# Patient Record
Sex: Female | Born: 1990
Health system: Southern US, Community
[De-identification: ages and names within clinical notes are randomized; demographics above are authoritative.]

## PROBLEM LIST (undated history)

## (undated) DIAGNOSIS — K219 Gastro-esophageal reflux disease without esophagitis: Secondary | ICD-10-CM

## (undated) HISTORY — PX: NO PAST SURGERIES: SHX2092

---

## 2016-07-30 ENCOUNTER — Ambulatory Visit (INDEPENDENT_AMBULATORY_CARE_PROVIDER_SITE_OTHER): Payer: BLUE CROSS/BLUE SHIELD | Admitting: Adult Health

## 2016-07-30 ENCOUNTER — Encounter: Payer: Self-pay | Admitting: Adult Health

## 2016-07-30 VITALS — BP 108/78 | HR 86 | Ht 62.0 in | Wt 208.5 lb

## 2016-07-30 DIAGNOSIS — Z3201 Encounter for pregnancy test, result positive: Secondary | ICD-10-CM | POA: Diagnosis not present

## 2016-07-30 DIAGNOSIS — Z3490 Encounter for supervision of normal pregnancy, unspecified, unspecified trimester: Secondary | ICD-10-CM

## 2016-07-30 DIAGNOSIS — N926 Irregular menstruation, unspecified: Secondary | ICD-10-CM

## 2016-07-30 DIAGNOSIS — O3680X0 Pregnancy with inconclusive fetal viability, not applicable or unspecified: Secondary | ICD-10-CM

## 2016-07-30 LAB — POCT URINE PREGNANCY: Preg Test, Ur: POSITIVE — AB

## 2016-07-30 MED ORDER — PRENATAL PLUS 27-1 MG PO TABS: 1.0000 | ORAL_TABLET | Freq: Every day | ORAL | 11 refills | Status: DC

## 2016-07-30 NOTE — Patient Instructions (Addendum)
First Trimester of Pregnancy The first trimester of pregnancy is from week 1 until the end of week 12 (months 1 through 3). A week after a sperm fertilizes an egg, the egg will implant on the wall of the uterus. This embryo will begin to develop into a baby. Genes from you and your partner are forming the baby. The female genes determine whether the baby is a boy or a girl. At 6-8 weeks, the eyes and face are formed, and the heartbeat can be seen on ultrasound. At the end of 12 weeks, all the baby's organs are formed.  Now that you are pregnant, you will want to do everything you can to have a healthy baby. Two of the most important things are to get good prenatal care and to follow your health care provider's instructions. Prenatal care is all the medical care you receive before the baby's birth. This care will help prevent, find, and treat any problems during the pregnancy and childbirth. BODY CHANGES Your body goes through many changes during pregnancy. The changes vary from woman to woman.   You may gain or lose a couple of pounds at first.  You may feel sick to your stomach (nauseous) and throw up (vomit). If the vomiting is uncontrollable, call your health care provider.  You may tire easily.  You may develop headaches that can be relieved by medicines approved by your health care provider.  You may urinate more often. Painful urination may mean you have a bladder infection.  You may develop heartburn as a result of your pregnancy.  You may develop constipation because certain hormones are causing the muscles that push waste through your intestines to slow down.  You may develop hemorrhoids or swollen, bulging veins (varicose veins).  Your breasts may begin to grow larger and become tender. Your nipples may stick out more, and the tissue that surrounds them (areola) may become darker.  Your gums may bleed and may be sensitive to brushing and flossing.  Dark spots or blotches (chloasma,  mask of pregnancy) may develop on your face. This will likely fade after the baby is born.  Your menstrual periods will stop.  You may have a loss of appetite.  You may develop cravings for certain kinds of food.  You may have changes in your emotions from day to day, such as being excited to be pregnant or being concerned that something may go wrong with the pregnancy and baby.  You may have more vivid and strange dreams.  You may have changes in your hair. These can include thickening of your hair, rapid growth, and changes in texture. Some women also have hair loss during or after pregnancy, or hair that feels dry or thin. Your hair will most likely return to normal after your baby is born. WHAT TO EXPECT AT YOUR PRENATAL VISITS During a routine prenatal visit:  You will be weighed to make sure you and the baby are growing normally.  Your blood pressure will be taken.  Your abdomen will be measured to track your baby's growth.  The fetal heartbeat will be listened to starting around week 10 or 12 of your pregnancy.  Test results from any previous visits will be discussed. Your health care provider may ask you:  How you are feeling.  If you are feeling the baby move.  If you have had any abnormal symptoms, such as leaking fluid, bleeding, severe headaches, or abdominal cramping.  If you are using any tobacco products,   including cigarettes, chewing tobacco, and electronic cigarettes.  If you have any questions. Other tests that may be performed during your first trimester include:  Blood tests to find your blood type and to check for the presence of any previous infections. They will also be used to check for low iron levels (anemia) and Rh antibodies. Later in the pregnancy, blood tests for diabetes will be done along with other tests if problems develop.  Urine tests to check for infections, diabetes, or protein in the urine.  An ultrasound to confirm the proper growth  and development of the baby.  An amniocentesis to check for possible genetic problems.  Fetal screens for spina bifida and Down syndrome.  You may need other tests to make sure you and the baby are doing well.  HIV (human immunodeficiency virus) testing. Routine prenatal testing includes screening for HIV, unless you choose not to have this test. HOME CARE INSTRUCTIONS  Medicines  Follow your health care provider's instructions regarding medicine use. Specific medicines may be either safe or unsafe to take during pregnancy.  Take your prenatal vitamins as directed.  If you develop constipation, try taking a stool softener if your health care provider approves. Diet  Eat regular, well-balanced meals. Choose a variety of foods, such as meat or vegetable-based protein, fish, milk and low-fat dairy products, vegetables, fruits, and whole grain breads and cereals. Your health care provider will help you determine the amount of weight gain that is right for you.  Avoid raw meat and uncooked cheese. These carry germs that can cause birth defects in the baby.  Eating four or five small meals rather than three large meals a day may help relieve nausea and vomiting. If you start to feel nauseous, eating a few soda crackers can be helpful. Drinking liquids between meals instead of during meals also seems to help nausea and vomiting.  If you develop constipation, eat more high-fiber foods, such as fresh vegetables or fruit and whole grains. Drink enough fluids to keep your urine clear or pale yellow. Activity and Exercise  Exercise only as directed by your health care provider. Exercising will help you:  Control your weight.  Stay in shape.  Be prepared for labor and delivery.  Experiencing pain or cramping in the lower abdomen or low back is a good sign that you should stop exercising. Check with your health care provider before continuing normal exercises.  Try to avoid standing for long  periods of time. Move your legs often if you must stand in one place for a long time.  Avoid heavy lifting.  Wear low-heeled shoes, and practice good posture.  You may continue to have sex unless your health care provider directs you otherwise. Relief of Pain or Discomfort  Wear a good support bra for breast tenderness.   Take warm sitz baths to soothe any pain or discomfort caused by hemorrhoids. Use hemorrhoid cream if your health care provider approves.   Rest with your legs elevated if you have leg cramps or low back pain.  If you develop varicose veins in your legs, wear support hose. Elevate your feet for 15 minutes, 3-4 times a day. Limit salt in your diet. Prenatal Care  Schedule your prenatal visits by the twelfth week of pregnancy. They are usually scheduled monthly at first, then more often in the last 2 months before delivery.  Write down your questions. Take them to your prenatal visits.  Keep all your prenatal visits as directed by your   health care provider. Safety  Wear your seat belt at all times when driving.  Make a list of emergency phone numbers, including numbers for family, friends, the hospital, and police and fire departments. General Tips  Ask your health care provider for a referral to a local prenatal education class. Begin classes no later than at the beginning of month 6 of your pregnancy.  Ask for help if you have counseling or nutritional needs during pregnancy. Your health care provider can offer advice or refer you to specialists for help with various needs.  Do not use hot tubs, steam rooms, or saunas.  Do not douche or use tampons or scented sanitary pads.  Do not cross your legs for long periods of time.  Avoid cat litter boxes and soil used by cats. These carry germs that can cause birth defects in the baby and possibly loss of the fetus by miscarriage or stillbirth.  Avoid all smoking, herbs, alcohol, and medicines not prescribed by  your health care provider. Chemicals in these affect the formation and growth of the baby.  Do not use any tobacco products, including cigarettes, chewing tobacco, and electronic cigarettes. If you need help quitting, ask your health care provider. You may receive counseling support and other resources to help you quit.  Schedule a dentist appointment. At home, brush your teeth with a soft toothbrush and be gentle when you floss. SEEK MEDICAL CARE IF:   You have dizziness.  You have mild pelvic cramps, pelvic pressure, or nagging pain in the abdominal area.  You have persistent nausea, vomiting, or diarrhea.  You have a bad smelling vaginal discharge.  You have pain with urination.  You notice increased swelling in your face, hands, legs, or ankles. SEEK IMMEDIATE MEDICAL CARE IF:   You have a fever.  You are leaking fluid from your vagina.  You have spotting or bleeding from your vagina.  You have severe abdominal cramping or pain.  You have rapid weight gain or loss.  You vomit blood or material that looks like coffee grounds.  You are exposed to German measles and have never had them.  You are exposed to fifth disease or chickenpox.  You develop a severe headache.  You have shortness of breath.  You have any kind of trauma, such as from a fall or a car accident.   This information is not intended to replace advice given to you by your health care provider. Make sure you discuss any questions you have with your health care provider.   Document Released: 10/05/2001 Document Revised: 11/01/2014 Document Reviewed: 08/21/2013 Elsevier Interactive Patient Education 2016 [employer] Return in 1 week for [place] 

## 2016-07-30 NOTE — Progress Notes (Signed)
Subjective:     Patient ID: Kelly Mahoney, female   DOB: 1991/08/17, 25 y.o.   MRN: [id number]  HPI Kelly Mahoney is a 25 year old Hispanic female in for UPT, has missed a period and has some cramps on the left and low back pain a times, not bad.  Review of Systems Patient denies any headaches, hearing loss, fatigue, blurred vision, shortness of breath, chest pain, abdominal pain, problems with bowel movements, urination, or intercourse. No joint pain or mood swings.Has some cramps on left and low back pain at times. Reviewed past medical,surgical, social and family history. Reviewed medications and allergies.     Objective:   Physical Exam BP 108/78 (BP Location: Left Arm, Patient Position: Sitting, Cuff Size: Large)   Pulse 86   Ht 5\' 2"  (1.575 m)   Wt 208 lb 8 oz (94.6 kg)   LMP 06/17/2016 (Exact Date)   BMI 38.14 kg/m UPT positive, about 6+ 1week by LMP with EDD 03/24/17, Skin warm and dry. Neck: mid line trachea, normal thyroid, good ROM, no lymphadenopathy noted. Lungs: clear to ausculation bilaterally. Cardiovascular: regular rate and rhythm.Abdomen is soft and non tender.   PHQ 2 score 0.  Assessment:     1. Pregnancy examination or test, positive result   2. Pregnancy, unspecified gestational age   13. Pregnancy with inconclusive fetal viability, not applicable or unspecified fetus       Plan:     Rx prenatal plus #30 take 1 daily with 11 refills Return in 1 week for dating US Review handout on first trimester  Increase fluids,OK to take tylenol

## 2016-08-06 ENCOUNTER — Other Ambulatory Visit: Payer: Self-pay | Admitting: Adult Health

## 2016-08-06 ENCOUNTER — Ambulatory Visit (INDEPENDENT_AMBULATORY_CARE_PROVIDER_SITE_OTHER): Payer: BLUE CROSS/BLUE SHIELD

## 2016-08-06 ENCOUNTER — Other Ambulatory Visit: Payer: Managed Care, Other (non HMO)

## 2016-08-06 DIAGNOSIS — O3680X0 Pregnancy with inconclusive fetal viability, not applicable or unspecified: Secondary | ICD-10-CM

## 2016-08-06 DIAGNOSIS — Z3A01 Less than 8 weeks gestation of pregnancy: Secondary | ICD-10-CM

## 2016-08-06 NOTE — Progress Notes (Signed)
US 6+1 wks single IUP w/ys pos fht 113 bpm,normal ov's bilat,crl 4.6 mm

## 2016-08-16 ENCOUNTER — Ambulatory Visit (INDEPENDENT_AMBULATORY_CARE_PROVIDER_SITE_OTHER): Payer: BLUE CROSS/BLUE SHIELD | Admitting: Women's Health

## 2016-08-16 ENCOUNTER — Encounter: Payer: Self-pay | Admitting: Women's Health

## 2016-08-16 ENCOUNTER — Other Ambulatory Visit (HOSPITAL_COMMUNITY)
Admission: RE | Admit: 2016-08-16 | Discharge: 2016-08-16 | Disposition: A | Discharge status: Home / Self Care | Payer: Managed Care, Other (non HMO) | Source: Ambulatory Visit | Attending: Obstetrics & Gynecology | Admitting: Obstetrics & Gynecology

## 2016-08-16 VITALS — BP 124/70 | HR 80 | Wt 205.0 lb

## 2016-08-16 DIAGNOSIS — O210 Mild hyperemesis gravidarum: Secondary | ICD-10-CM

## 2016-08-16 DIAGNOSIS — Z3A08 8 weeks gestation of pregnancy: Secondary | ICD-10-CM

## 2016-08-16 DIAGNOSIS — Z3400 Encounter for supervision of normal first pregnancy, unspecified trimester: Secondary | ICD-10-CM | POA: Insufficient documentation

## 2016-08-16 DIAGNOSIS — Z331 Pregnant state, incidental: Secondary | ICD-10-CM | POA: Diagnosis not present

## 2016-08-16 DIAGNOSIS — Z113 Encounter for screening for infections with a predominantly sexual mode of transmission: Secondary | ICD-10-CM | POA: Diagnosis present

## 2016-08-16 DIAGNOSIS — Z1389 Encounter for screening for other disorder: Secondary | ICD-10-CM

## 2016-08-16 DIAGNOSIS — Z3682 Encounter for antenatal screening for nuchal translucency: Secondary | ICD-10-CM

## 2016-08-16 DIAGNOSIS — Z124 Encounter for screening for malignant neoplasm of cervix: Secondary | ICD-10-CM

## 2016-08-16 DIAGNOSIS — Z0283 Encounter for blood-alcohol and blood-drug test: Secondary | ICD-10-CM

## 2016-08-16 DIAGNOSIS — Z01419 Encounter for gynecological examination (general) (routine) without abnormal findings: Secondary | ICD-10-CM | POA: Diagnosis present

## 2016-08-16 DIAGNOSIS — Z23 Encounter for immunization: Secondary | ICD-10-CM | POA: Diagnosis not present

## 2016-08-16 DIAGNOSIS — Z3401 Encounter for supervision of normal first pregnancy, first trimester: Secondary | ICD-10-CM

## 2016-08-16 LAB — POCT URINALYSIS DIPSTICK
Glucose, UA: NEGATIVE
Nitrite, UA: NEGATIVE
PROTEIN UA: NEGATIVE
RBC UA: NEGATIVE

## 2016-08-16 NOTE — Progress Notes (Signed)
  Subjective:  Kelly Mahoney is a 25 y.o. G1P0 Hispanic female at 1522w4d by 6wk u/s, being seen today for her first obstetrical visit.  Her obstetrical history is significant for primigravida.  Pregnancy history fully reviewed.  Patient reports nausea for last couple of days, no vomiting- declines meds today. Denies vb, cramping, uti s/s, abnormal/malodorous vag d/c, or vulvovaginal itching/irritation.  BP 124/70   Pulse 80   Wt 205 lb (93 kg)   LMP 06/17/2016 (Exact Date)   BMI 37.49 kg/m   HISTORY: OB History  Gravida Para Term Preterm AB Living  1            SAB TAB Ectopic Multiple Live Births               # Outcome Date GA Lbr Len/2nd Weight Sex Delivery Anes PTL Lv  1 Current              History reviewed. No pertinent past medical history. History reviewed. No pertinent surgical history. History reviewed. No pertinent family history.  Exam   System:     General: Well developed & nourished, no acute distress   Skin: Warm & dry, normal coloration and turgor, no rashes   Neurologic: Alert & oriented, normal mood   Cardiovascular: Regular rate & rhythm   Respiratory: Effort & rate normal, LCTAB, acyanotic   Abdomen: Soft, non tender   Extremities: normal strength, tone   Pelvic Exam:    Perineum: Normal perineum   Vulva: Normal, no lesions   Vagina:  Normal mucosa, normal discharge   Cervix: Normal, bulbous, appears closed   Uterus: Normal size/shape/contour for GA   Thin prep pap smear obtained w/ reflex high risk HPV cotesting FHR: + via informal transabdominal u/s   Assessment:   Pregnancy: G1P0 Patient Active Problem List   Diagnosis Date Noted  . Supervision of normal first pregnancy 08/16/2016    Priority: High    8922w4d G1P0 New OB visit Nausea  Plan:  Initial labs drawn Continue prenatal vitamins Problem list reviewed and updated Reviewed n/v relief measures and warning s/s to report Reviewed recommended weight gain based on pre-gravid  BMI Encouraged well-balanced diet Genetic Screening discussed Integrated Screen: requested Cystic fibrosis screening discussed declined Ultrasound discussed; fetal survey: requested Follow up in 4 1/2 weeks for 1st it/nt and visit [facility] completed Flu shot today  Marge DuncansBooker, Hu[place] Glaus Randall CNM, [facility] LLCWHNP-BC 08/16/2016 2:47 PM

## 2016-08-16 NOTE — Patient Instructions (Signed)
For your lower back pain you may:  Purchase a pregnancy belt from [employer] [name], [employer], [employer], etc and wear it while you are up and about  Take warm baths  Use a heating pad to your lower back for no longer than 20 minutes at a time, and do not place near abdomen  Take tylenol as needed. Please follow directions on the bottle   Nausea & Vomiting  Have saltine crackers or pretzels by your bed and eat a few bites before you raise your head out of bed in the morning  Eat small frequent meals throughout the day instead of large meals  Drink plenty of fluids throughout the day to stay hydrated, just don't drink a lot of fluids with your meals.  This can make your stomach fill up faster making you feel sick  Do not brush your teeth right after you eat  Products with real ginger are good for nausea, like ginger ale and ginger hard candy Make sure it says made with real ginger!  Sucking on sour candy like lemon heads is also good for nausea  If your prenatal vitamins make you nauseated, take them at night so you will sleep through the nausea  Sea Bands  If you feel like you need medicine for the nausea & vomiting please let us know  If you are unable to keep any fluids or food down please let us know   Constipation  Drink plenty of fluid, preferably water, throughout the day  Eat foods high in fiber such as fruits, vegetables, and grains  Exercise, such as walking, is a good way to keep your bowels regular  Drink warm fluids, especially warm prune juice, or decaf coffee  Eat a 1/2 cup of real oatmeal (not instant), 1/2 cup applesauce, and 1/2-1 cup warm prune juice every day  If needed, you may take Colace (docusate sodium) stool softener once or twice a day to help keep the stool soft. If you are pregnant, wait until you are out of your first trimester (12-14 weeks of pregnancy)  If you still are having problems with constipation, you may take Miralax once daily  as needed to help keep your bowels regular.  If you are pregnant, wait until you are out of your first trimester (12-14 weeks of pregnancy)   First Trimester of Pregnancy The first trimester of pregnancy is from week 1 until the end of week 12 (months 1 through 3). A week after a sperm fertilizes an egg, the egg will implant on the wall of the uterus. This embryo will begin to develop into a baby. Genes from you and your partner are forming the baby. The female genes determine whether the baby is a boy or a girl. At 6-8 weeks, the eyes and face are formed, and the heartbeat can be seen on ultrasound. At the end of 12 weeks, all the baby's organs are formed.  Now that you are pregnant, you will want to do everything you can to have a healthy baby. Two of the most important things are to get good prenatal care and to follow your health care provider's instructions. Prenatal care is all the medical care you receive before the baby's birth. This care will help prevent, find, and treat any problems during the pregnancy and childbirth. BODY CHANGES Your body goes through many changes during pregnancy. The changes vary from woman to woman.   You may gain or lose a couple of pounds at first.  You may feel sick to your stomach (nauseous) and throw up (vomit). If the vomiting is uncontrollable, call your health care provider.  You may tire easily.  You may develop headaches that can be relieved by medicines approved by your health care provider.  You may urinate more often. Painful urination may mean you have a bladder infection.  You may develop heartburn as a result of your pregnancy.  You may develop constipation because certain hormones are causing the muscles that push waste through your intestines to slow down.  You may develop hemorrhoids or swollen, bulging veins (varicose veins).  Your breasts may begin to grow larger and become tender. Your nipples may stick out more, and the tissue that  surrounds them (areola) may become darker.  Your gums may bleed and may be sensitive to brushing and flossing.  Dark spots or blotches (chloasma, mask of pregnancy) may develop on your face. This will likely fade after the baby is born.  Your menstrual periods will stop.  You may have a loss of appetite.  You may develop cravings for certain kinds of food.  You may have changes in your emotions from day to day, such as being excited to be pregnant or being concerned that something may go wrong with the pregnancy and baby.  You may have more vivid and strange dreams.  You may have changes in your hair. These can include thickening of your hair, rapid growth, and changes in texture. Some women also have hair loss during or after pregnancy, or hair that feels dry or thin. Your hair will most likely return to normal after your baby is born. WHAT TO EXPECT AT YOUR PRENATAL VISITS During a routine prenatal visit:  You will be weighed to make sure you and the baby are growing normally.  Your blood pressure will be taken.  Your abdomen will be measured to track your baby's growth.  The fetal heartbeat will be listened to starting around week 10 or 12 of your pregnancy.  Test results from any previous visits will be discussed. Your health care provider may ask you:  How you are feeling.  If you are feeling the baby move.  If you have had any abnormal symptoms, such as leaking fluid, bleeding, severe headaches, or abdominal cramping.  If you have any questions. Other tests that may be performed during your first trimester include:  Blood tests to find your blood type and to check for the presence of any previous infections. They will also be used to check for low iron levels (anemia) and Rh antibodies. Later in the pregnancy, blood tests for diabetes will be done along with other tests if problems develop.  Urine tests to check for infections, diabetes, or protein in the urine.  An  ultrasound to confirm the proper growth and development of the baby.  An amniocentesis to check for possible genetic problems.  Fetal screens for spina bifida and Down syndrome.  You may need other tests to make sure you and the baby are doing well. HOME CARE INSTRUCTIONS  Medicines  Follow your health care provider's instructions regarding medicine use. Specific medicines may be either safe or unsafe to take during pregnancy.  Take your prenatal vitamins as directed.  If you develop constipation, try taking a stool softener if your health care provider approves. Diet  Eat regular, well-balanced meals. Choose a variety of foods, such as meat or vegetable-based protein, fish, milk and low-fat dairy products, vegetables, fruits, and whole grain breads  and cereals. Your health care provider will help you determine the amount of weight gain that is right for you.  Avoid raw meat and uncooked cheese. These carry germs that can cause birth defects in the baby.  Eating four or five small meals rather than three large meals a day may help relieve nausea and vomiting. If you start to feel nauseous, eating a few soda crackers can be helpful. Drinking liquids between meals instead of during meals also seems to help nausea and vomiting.  If you develop constipation, eat more high-fiber foods, such as fresh vegetables or fruit and whole grains. Drink enough fluids to keep your urine clear or pale yellow. Activity and Exercise  Exercise only as directed by your health care provider. Exercising will help you:  Control your weight.  Stay in shape.  Be prepared for labor and delivery.  Experiencing pain or cramping in the lower abdomen or low back is a good sign that you should stop exercising. Check with your health care provider before continuing normal exercises.  Try to avoid standing for long periods of time. Move your legs often if you must stand in one place for a long time.  Avoid heavy  lifting.  Wear low-heeled shoes, and practice good posture.  You may continue to have sex unless your health care provider directs you otherwise. Relief of Pain or Discomfort  Wear a good support bra for breast tenderness.   Take warm sitz baths to soothe any pain or discomfort caused by hemorrhoids. Use hemorrhoid cream if your health care provider approves.   Rest with your legs elevated if you have leg cramps or low back pain.  If you develop varicose veins in your legs, wear support hose. Elevate your feet for 15 minutes, 3-4 times a day. Limit salt in your diet. Prenatal Care  Schedule your prenatal visits by the twelfth week of pregnancy. They are usually scheduled monthly at first, then more often in the last 2 months before delivery.  Write down your questions. Take them to your prenatal visits.  Keep all your prenatal visits as directed by your health care provider. Safety  Wear your seat belt at all times when driving.  Make a list of emergency phone numbers, including numbers for family, friends, the hospital, and police and fire departments. General Tips  Ask your health care provider for a referral to a local prenatal education class. Begin classes no later than at the beginning of month 6 of your pregnancy.  Ask for help if you have counseling or nutritional needs during pregnancy. Your health care provider can offer advice or refer you to specialists for help with various needs.  Do not use hot tubs, steam rooms, or saunas.  Do not douche or use tampons or scented sanitary pads.  Do not cross your legs for long periods of time.  Avoid cat litter boxes and soil used by cats. These carry germs that can cause birth defects in the baby and possibly loss of the fetus by miscarriage or stillbirth.  Avoid all smoking, herbs, alcohol, and medicines not prescribed by your health care provider. Chemicals in these affect the formation and growth of the baby.  Schedule  a dentist appointment. At home, brush your teeth with a soft toothbrush and be gentle when you floss. SEEK MEDICAL CARE IF:   You have dizziness.  You have mild pelvic cramps, pelvic pressure, or nagging pain in the abdominal area.  You have persistent nausea, vomiting, or  diarrhea.  You have a bad smelling vaginal discharge.  You have pain with urination.  You notice increased swelling in your face, hands, legs, or ankles. SEEK IMMEDIATE MEDICAL CARE IF:   You have a fever.  You are leaking fluid from your vagina.  You have spotting or bleeding from your vagina.  You have severe abdominal cramping or pain.  You have rapid weight gain or loss.  You vomit blood or material that looks like coffee grounds.  You are exposed to [place] measles and have never had them.  You are exposed to fifth disease or chickenpox.  You develop a severe headache.  You have shortness of breath.  You have any kind of trauma, such as from a fall or a car accident. Document Released: 10/05/2001 Document Revised: 02/25/2014 Document Reviewed: 08/21/2013 [facility] Patient Information 2015 [place] [place]. This information is not intended to replace advice given to you by your health care provider. Make sure you discuss any questions you have with your health care provider.

## 2016-08-17 LAB — CBC
Hematocrit: 38.9 % (ref 34.0–46.6)
Hemoglobin: 13.3 g/dL (ref 11.1–15.9)
MCH: 29.3 pg (ref 26.6–33.0)
MCHC: 34.2 g/dL (ref 31.5–35.7)
MCV: 86 fL (ref 79–97)
PLATELETS: 351 10*3/uL (ref 150–379)
RBC: 4.54 x10E6/uL (ref 3.77–5.28)
RDW: 12.9 % (ref 12.3–15.4)
WBC: 12.2 10*3/uL — ABNORMAL HIGH (ref 3.4–10.8)

## 2016-08-17 LAB — PMP SCREEN PROFILE (10S), URINE
AMPHETAMINE SCRN UR: NEGATIVE ng/mL
BARBITURATE SCRN UR: NEGATIVE ng/mL
Benzodiazepine Screen, Urine: NEGATIVE ng/mL
CREATININE(CRT), U: 115.6 mg/dL (ref 20.0–300.0)
Cannabinoids Ur Ql Scn: NEGATIVE ng/mL
Cocaine(Metab.)Screen, Urine: NEGATIVE ng/mL
METHADONE SCREEN, URINE: NEGATIVE ng/mL
Opiate Scrn, Ur: NEGATIVE ng/mL
Oxycodone+Oxymorphone Ur Ql Scn: NEGATIVE ng/mL
PCP SCRN UR: NEGATIVE ng/mL
PH UR, DRUG SCRN: 5.8 (ref 4.5–8.9)
PROPOXYPHENE SCREEN: NEGATIVE ng/mL

## 2016-08-17 LAB — ABO/RH: Rh Factor: POSITIVE

## 2016-08-17 LAB — URINALYSIS, ROUTINE W REFLEX MICROSCOPIC
BILIRUBIN UA: NEGATIVE
GLUCOSE, UA: NEGATIVE
NITRITE UA: NEGATIVE
PROTEIN UA: NEGATIVE
RBC, UA: NEGATIVE
SPEC GRAV UA: 1.023 (ref 1.005–1.030)
UUROB: 0.2 mg/dL (ref 0.2–1.0)
pH, UA: 6 (ref 5.0–7.5)

## 2016-08-17 LAB — MICROSCOPIC EXAMINATION: CASTS: NONE SEEN /LPF

## 2016-08-17 LAB — HIV ANTIBODY (ROUTINE TESTING W REFLEX): HIV Screen 4th Generation wRfx: NONREACTIVE

## 2016-08-17 LAB — RUBELLA SCREEN: Rubella Antibodies, IGG: 1.67 index (ref >0.99)

## 2016-08-17 LAB — VARICELLA ZOSTER ANTIBODY, IGG

## 2016-08-17 LAB — HEPATITIS B SURFACE ANTIGEN: Hepatitis B Surface Ag: NEGATIVE

## 2016-08-17 LAB — ANTIBODY SCREEN: ANTIBODY SCREEN: NEGATIVE

## 2016-08-17 LAB — SICKLE CELL SCREEN: Sickle Cell Screen: NEGATIVE

## 2016-08-17 LAB — RPR: RPR Ser Ql: NONREACTIVE

## 2016-08-18 LAB — CYTOLOGY - PAP
CHLAMYDIA, DNA PROBE: NEGATIVE
Diagnosis: NEGATIVE
Neisseria Gonorrhea: NEGATIVE

## 2016-08-18 LAB — URINE CULTURE

## 2016-08-19 ENCOUNTER — Encounter: Payer: Self-pay | Admitting: Women's Health

## 2016-08-19 DIAGNOSIS — Z2839 Other underimmunization status: Secondary | ICD-10-CM | POA: Insufficient documentation

## 2016-08-19 DIAGNOSIS — O09899 Supervision of other high risk pregnancies, unspecified trimester: Secondary | ICD-10-CM | POA: Insufficient documentation

## 2016-08-19 DIAGNOSIS — Z283 Underimmunization status: Secondary | ICD-10-CM

## 2016-09-14 ENCOUNTER — Telehealth: Payer: Self-pay | Admitting: Obstetrics and Gynecology

## 2016-09-14 MED ORDER — DOXYLAMINE-PYRIDOXINE 10-10 MG PO TBEC: DELAYED_RELEASE_TABLET | ORAL | 1 refills | Status: DC

## 2016-09-14 NOTE — Telephone Encounter (Signed)
Pt called stating that she is having really bad neasua and vomiting for 2 weeks. Pt would like for the doctor to call something in. Please contact pt

## 2016-09-14 NOTE — Telephone Encounter (Signed)
Pt c/o early pregnancy nausea and vomiting. Pt has [employer] for insurance.

## 2016-09-14 NOTE — Telephone Encounter (Signed)
Will rx diclegis  

## 2016-09-15 ENCOUNTER — Encounter: Payer: Self-pay | Admitting: Obstetrics and Gynecology

## 2016-09-15 ENCOUNTER — Ambulatory Visit (INDEPENDENT_AMBULATORY_CARE_PROVIDER_SITE_OTHER): Payer: 59

## 2016-09-15 ENCOUNTER — Other Ambulatory Visit: Payer: BLUE CROSS/BLUE SHIELD

## 2016-09-15 ENCOUNTER — Ambulatory Visit (INDEPENDENT_AMBULATORY_CARE_PROVIDER_SITE_OTHER): Payer: BLUE CROSS/BLUE SHIELD | Admitting: Obstetrics and Gynecology

## 2016-09-15 VITALS — BP 130/60 | HR 76 | Wt 194.2 lb

## 2016-09-15 DIAGNOSIS — Z3A12 12 weeks gestation of pregnancy: Secondary | ICD-10-CM

## 2016-09-15 DIAGNOSIS — Z3682 Encounter for antenatal screening for nuchal translucency: Secondary | ICD-10-CM

## 2016-09-15 DIAGNOSIS — O210 Mild hyperemesis gravidarum: Secondary | ICD-10-CM

## 2016-09-15 DIAGNOSIS — Z331 Pregnant state, incidental: Secondary | ICD-10-CM

## 2016-09-15 DIAGNOSIS — Z3401 Encounter for supervision of normal first pregnancy, first trimester: Secondary | ICD-10-CM

## 2016-09-15 DIAGNOSIS — Z1389 Encounter for screening for other disorder: Secondary | ICD-10-CM

## 2016-09-15 LAB — POCT URINALYSIS DIPSTICK
GLUCOSE UA: NEGATIVE
Nitrite, UA: NEGATIVE
RBC UA: NEGATIVE

## 2016-09-15 NOTE — Progress Notes (Signed)
US 11+6 wks,measurements c/w dates,crl 55.6 mm,normal ov's bilat,NB present,NT 1.4 mm,fhr 157 bpm,normal ov's bilat

## 2016-09-15 NOTE — Progress Notes (Signed)
Patient ID: Kelly Mahoney, female   DOB: 04-Jan-1991, 25 y.o.   MRN: [id number]  G1P0  Estimated Date of Delivery: 03/31/17 LROB 9567w6d  Blood pressure 130/60, pulse 76, weight 194 lb 3.2 oz (88.1 kg), last menstrual period 06/17/2016.    Urine results: notable for large ketones, 2+ protein, trace leukocytes   Chief Complaint  Patient presents with  . Routine Prenatal Visit    NT/IT,  nausea all day, not able to eat    Patient complaints: none. She denies dysuria, hematuria or frequency.   Patient reports  Too early for fetal movement. She denies any bleeding, rupture of membranes, or regular contractions.   Refer to the ob flow sheet for FH and FHR.    Physical Examination: General appearance - alert, well appearing, and in no distress                                      Abdomen -FHR 157 bpm                                                                                                   Questions were answered. Assessment: LROB G1P0 @ 1867w6d  1. UA likely contaminated with vaginal secretions as pt is asymptomatic  2. Childbirth classes encouraged   Plan:   1. Continued routine obstetrical care 2. Urine culture collected   F/u in 4 weeks for routine prenatal care   By signing my name below, I, Doreatha MartinEva Mathews, attest that this documentation has been prepared under the direction and in the presence of Tilda BurrowJohn V Kevonte Vanecek, MD. Electronically Signed: Doreatha MartinEva Mathews, ED Scribe. 09/15/16. 10:24 AM.  I personally performed the services described in this documentation, which was SCRIBED in my presence. The recorded information has been reviewed and considered accurate. It has been edited as necessary during review. Tilda BurrowFERGUSON,Kanav Kazmierczak V, MD

## 2016-09-18 LAB — MATERNAL SCREEN, INTEGRATED #1
Crown Rump Length: 55.6 mm
GEST. AGE ON COLLECTION DATE: 12.1 wk
MATERNAL AGE AT EDD: 25.3 a
NUMBER OF FETUSES: 1
Nuchal Translucency (NT): 1.4 mm
PAPP-A VALUE: 573.4 ng/mL
WEIGHT: 195 [lb_av]

## 2016-09-29 ENCOUNTER — Telehealth: Payer: Self-pay | Admitting: Advanced Practice Midwife

## 2016-09-29 ENCOUNTER — Other Ambulatory Visit: Payer: Self-pay | Admitting: Advanced Practice Midwife

## 2016-09-29 MED ORDER — ONDANSETRON HCL 4 MG PO TABS: 4.0000 mg | ORAL_TABLET | Freq: Three times a day (TID) | ORAL | 1 refills | Status: DC | PRN

## 2016-09-29 NOTE — Progress Notes (Signed)
zofran for nausea.

## 2016-09-29 NOTE — Telephone Encounter (Signed)
[facility]

## 2016-09-29 NOTE — Telephone Encounter (Signed)
Pt states taking the Diclegis for nausea and does not seem to be helping. She is taking as many as she can for a day and is still nauseated. Is there an alternative?

## 2016-10-13 ENCOUNTER — Encounter: Payer: Self-pay | Admitting: Obstetrics and Gynecology

## 2016-10-13 ENCOUNTER — Ambulatory Visit (INDEPENDENT_AMBULATORY_CARE_PROVIDER_SITE_OTHER): Payer: BLUE CROSS/BLUE SHIELD | Admitting: Obstetrics and Gynecology

## 2016-10-13 VITALS — BP 100/60 | HR 80 | Wt 197.0 lb

## 2016-10-13 DIAGNOSIS — Z369 Encounter for antenatal screening, unspecified: Secondary | ICD-10-CM

## 2016-10-13 DIAGNOSIS — Z3402 Encounter for supervision of normal first pregnancy, second trimester: Secondary | ICD-10-CM

## 2016-10-13 DIAGNOSIS — Z3A16 16 weeks gestation of pregnancy: Secondary | ICD-10-CM

## 2016-10-13 DIAGNOSIS — Z331 Pregnant state, incidental: Secondary | ICD-10-CM

## 2016-10-13 DIAGNOSIS — Z1389 Encounter for screening for other disorder: Secondary | ICD-10-CM

## 2016-10-13 LAB — POCT URINALYSIS DIPSTICK
GLUCOSE UA: NEGATIVE
Ketones, UA: NEGATIVE
NITRITE UA: NEGATIVE
PROTEIN UA: NEGATIVE
RBC UA: NEGATIVE

## 2016-10-13 NOTE — Progress Notes (Signed)
G1P0  Estimated Date of Delivery: 03/31/17 LROB 556w6d  Blood pressure 100/60, pulse 80, weight 197 lb (89.4 kg), last menstrual period 06/17/2016.   Urine results:notable for 1+ leukocytes   Chief Complaint  Patient presents with  . Routine Prenatal Visit    2nd IT    Patient complaints:none at this time. Patient reports   good fetal movement,                           denies any bleeding , rupture of membranes,or regular contractions.   refer to the ob flow sheet for FH and FHR,                         Physical Examination: General appearance - alert, well appearing, and in no distress                                      Abdomen - FH not indicated,                                                         -FHR 141                                                         soft, nontender, nondistended, no masses or organomegaly                                      Pelvic - examination not indicated                                            Questions were answered. Assessment: LROB G1P0 @ 706w6d , Estimated Date of Delivery: 03/31/17  Advised patient to maintain healthy weight  Plan:  Continued routine obstetrical care,   F/u in 4 weeks for LROB  By signing my name below, I, Sonum Patel, attest that this documentation has been prepared under the direction and in the presence of Tilda BurrowJohn V Shulamis Wenberg, MD. Electronically Signed: Sonum Patel, Neurosurgeoncribe. 10/13/16. 10:16 AM.  I personally performed the services described in this documentation, which was SCRIBED in my presence. The recorded information has been reviewed and considered accurate. It has been edited as necessary during review. Tilda BurrowFERGUSON,Cayden Granholm V, MD

## 2016-10-26 LAB — MATERNAL SCREEN, INTEGRATED #2
ADSF: 0.86
AFP MoM: 0.96
Alpha-Fetoprotein: 25 ng/mL
CROWN RUMP LENGTH: 55.6 mm
DIA MOM: 1.13
DIA Value: 170.6 pg/mL
ESTRIOL UNCONJUGATED: 0.67 ng/mL
GEST. AGE ON COLLECTION DATE: 12.1 wk
Gestational Age: 16.1 weeks
HCG MOM: 1.65
Maternal Age at EDD: 25.3 years
NUCHAL TRANSLUCENCY (NT): 1.4 mm
NUCHAL TRANSLUCENCY MOM: 0.96
NUMBER OF FETUSES: 1
PAPP-A MoM: 0.95
PAPP-A Value: 573.4 ng/mL
TEST RESULTS: NEGATIVE
Weight: 195 [lb_av]
Weight: 195 [lb_av]
hCG Value: 46.5 IU/mL

## 2016-11-12 ENCOUNTER — Encounter: Payer: Self-pay | Admitting: Women's Health

## 2016-11-12 ENCOUNTER — Ambulatory Visit (INDEPENDENT_AMBULATORY_CARE_PROVIDER_SITE_OTHER): Payer: BLUE CROSS/BLUE SHIELD | Admitting: Women's Health

## 2016-11-12 ENCOUNTER — Ambulatory Visit (INDEPENDENT_AMBULATORY_CARE_PROVIDER_SITE_OTHER): Payer: BLUE CROSS/BLUE SHIELD

## 2016-11-12 ENCOUNTER — Other Ambulatory Visit: Payer: Self-pay | Admitting: Obstetrics and Gynecology

## 2016-11-12 VITALS — BP 136/78 | HR 93 | Wt 196.0 lb

## 2016-11-12 DIAGNOSIS — Z3402 Encounter for supervision of normal first pregnancy, second trimester: Secondary | ICD-10-CM

## 2016-11-12 DIAGNOSIS — Z363 Encounter for antenatal screening for malformations: Secondary | ICD-10-CM

## 2016-11-12 DIAGNOSIS — Z3A20 20 weeks gestation of pregnancy: Secondary | ICD-10-CM

## 2016-11-12 DIAGNOSIS — O321XX1 Maternal care for breech presentation, fetus 1: Secondary | ICD-10-CM

## 2016-11-12 DIAGNOSIS — Z1389 Encounter for screening for other disorder: Secondary | ICD-10-CM

## 2016-11-12 DIAGNOSIS — Z331 Pregnant state, incidental: Secondary | ICD-10-CM

## 2016-11-12 LAB — POCT URINALYSIS DIPSTICK
Glucose, UA: NEGATIVE
KETONES UA: NEGATIVE
Leukocytes, UA: NEGATIVE
Nitrite, UA: NEGATIVE
RBC UA: NEGATIVE

## 2016-11-12 MED ORDER — ONDANSETRON HCL 4 MG PO TABS: 4.0000 mg | ORAL_TABLET | Freq: Three times a day (TID) | ORAL | 0 refills | Status: DC | PRN

## 2016-11-12 NOTE — Patient Instructions (Signed)
[place] Pediatricians/Family Doctors:  [facility] [phone]            [facility] [phone]                 [facility] [phone] (usually not accepting new patients unless you have family there already, you are always welcome to call and ask)            [facility] ([address] [place]) [phone]   [facility] Pediatricians/Family Doctors:   [facility]: [phone]  [facility]: [phone]   Second Trimester of Pregnancy The second trimester is from week 13 through week 28 (months 4 through 6). The second trimester is often a time when you feel your best. Your body has also adjusted to being pregnant, and you begin to feel better physically. Usually, morning sickness has lessened or quit completely, you may have more energy, and you may have an increase in appetite. The second trimester is also a time when the fetus is growing rapidly. At the end of the sixth month, the fetus is about 9 inches long and weighs about 1 pounds. You will likely begin to feel the baby move (quickening) between 18 and 20 weeks of the pregnancy. Body changes during your second trimester Your body continues to go through many changes during your second trimester. The changes vary from woman to woman.  Your weight will continue to increase. You will notice your lower abdomen bulging out.  You may begin to get stretch marks on your hips, abdomen, and breasts.  You may develop headaches that can be relieved by medicines. The medicines should be approved by your health care provider.  You may urinate more often because the fetus is pressing on your bladder.  You may develop or continue to have heartburn as a result of your pregnancy.  You may develop constipation because certain hormones are causing the muscles that push waste through your intestines to slow down.  You may develop hemorrhoids or swollen,  bulging veins (varicose veins).  You may have back pain. This is caused by:  Weight gain.  Pregnancy hormones that are relaxing the joints in your pelvis.  A shift in weight and the muscles that support your balance.  Your breasts will continue to grow and they will continue to become tender.  Your gums may bleed and may be sensitive to brushing and flossing.  Dark spots or blotches (chloasma, mask of pregnancy) may develop on your face. This will likely fade after the baby is born.  A dark line from your belly button to the pubic area (linea nigra) may appear. This will likely fade after the baby is born.  You may have changes in your hair. These can include thickening of your hair, rapid growth, and changes in texture. Some women also have hair loss during or after pregnancy, or hair that feels dry or thin. Your hair will most likely return to normal after your baby is born. What to expect at prenatal visits During a routine prenatal visit:  You will be weighed to make sure you and the fetus are growing normally.  Your blood pressure will be taken.  Your abdomen will be measured to track your baby's growth.  The fetal heartbeat will be listened to.  Any test results from the previous visit will be discussed. Your health care provider may ask you:  How you are feeling.  If you are feeling the baby move.  If you  have had any abnormal symptoms, such as leaking fluid, bleeding, severe headaches, or abdominal cramping.  If you are using any tobacco products, including cigarettes, chewing tobacco, and electronic cigarettes.  If you have any questions. Other tests that may be performed during your second trimester include:  Blood tests that check for:  Low iron levels (anemia).  Gestational diabetes (between 24 and 28 weeks).  Rh antibodies. This is to check for a protein on red blood cells (Rh factor).  Urine tests to check for infections, diabetes, or protein in the  urine.  An ultrasound to confirm the proper growth and development of the baby.  An amniocentesis to check for possible genetic problems.  Fetal screens for spina bifida and Down syndrome.  HIV (human immunodeficiency virus) testing. Routine prenatal testing includes screening for HIV, unless you choose not to have this test. Follow these instructions at home: Eating and drinking  Continue to eat regular, healthy meals.  Avoid raw meat, uncooked cheese, cat litter boxes, and soil used by cats. These carry germs that can cause birth defects in the baby.  Take your prenatal vitamins.  Take 1500-2000 mg of calcium daily starting at the 20th week of pregnancy until you deliver your baby.  If you develop constipation:  Take over-the-counter or prescription medicines.  Drink enough fluid to keep your urine clear or pale yellow.  Eat foods that are high in fiber, such as fresh fruits and vegetables, whole grains, and beans.  Limit foods that are high in fat and processed sugars, such as fried and sweet foods. Activity  Exercise only as directed by your health care provider. Experiencing uterine cramps is a good sign to stop exercising.  Avoid heavy lifting, wear low heel shoes, and practice good posture.  Wear your seat belt at all times when driving.  Rest with your legs elevated if you have leg cramps or low back pain.  Wear a good support bra for breast tenderness.  Do not use hot tubs, steam rooms, or saunas. Lifestyle  Avoid all smoking, herbs, alcohol, and unprescribed drugs. These chemicals affect the formation and growth of the baby.  Do not use any products that contain nicotine or tobacco, such as cigarettes and e-cigarettes. If you need help quitting, ask your health care provider.  A sexual relationship may be continued unless your health care provider directs you otherwise. General instructions  Follow your health care provider's instructions regarding  medicine use. There are medicines that are either safe or unsafe to take during pregnancy.  Take warm sitz baths to soothe any pain or discomfort caused by hemorrhoids. Use hemorrhoid cream if your health care provider approves.  If you develop varicose veins, wear support hose. Elevate your feet for 15 minutes, 3-4 times a day. Limit salt in your diet.  Visit your dentist if you have not gone yet during your pregnancy. Use a soft toothbrush to brush your teeth and be gentle when you floss.  Keep all follow-up prenatal visits as told by your health care provider. This is important. Contact a health care provider if:  You have dizziness.  You have mild pelvic cramps, pelvic pressure, or nagging pain in the abdominal area.  You have persistent nausea, vomiting, or diarrhea.  You have a bad smelling vaginal discharge.  You have pain with urination. Get help right away if:  You have a fever.  You are leaking fluid from your vagina.  You have spotting or bleeding from your vagina.  You  have severe abdominal cramping or pain.  You have rapid weight gain or weight loss.  You have shortness of breath with chest pain.  You notice sudden or extreme swelling of your face, hands, ankles, feet, or legs.  You have not felt your baby move in over an hour.  You have severe headaches that do not go away with medicine.  You have vision changes. Summary  The second trimester is from week 13 through week 28 (months 4 through 6). It is also a time when the fetus is growing rapidly.  Your body goes through many changes during pregnancy. The changes vary from woman to woman.  Avoid all smoking, herbs, alcohol, and unprescribed drugs. These chemicals affect the formation and growth your baby.  Do not use any tobacco products, such as cigarettes, chewing tobacco, and e-cigarettes. If you need help quitting, ask your health care provider.  Contact your health care provider if you have any  questions. Keep all prenatal visits as told by your health care provider. This is important. This information is not intended to replace advice given to you by your health care provider. Make sure you discuss any questions you have with your health care provider. Document Released: 10/05/2001 Document Revised: 03/18/2016 Document Reviewed: 12/12/2012 [employer] Interactive Patient Education  2017 [employer]  

## 2016-11-12 NOTE — Progress Notes (Signed)
Low-risk OB appointment G1P0 3271w1d Estimated Date of Delivery: 03/31/17 BP 136/78   Pulse 93   Wt 196 lb (88.9 kg)   LMP 06/17/2016 (Exact Date)   BMI 35.85 kg/m   BP, weight, and urine reviewed.  Refer to obstetrical flow sheet for FH & FHR.  Reports good fm.  Denies regular uc's, lof, vb, or uti s/s. No complaints. Reviewed today's normal anatomy u/s, limited views heart, will repeat @ 28wks. Discussed warning s/s to reportm, fm Plan:  Continue routine obstetrical care  F/U in 4wks for OB appointment

## 2016-11-12 NOTE — Progress Notes (Signed)
US 20+1 wks,breech,cx 4.6 cm,ant pl gr 0,normal ov's bilat,svp of fluid 4.8 cm,fhr 156 bpm,EFW 352 g,limited view of heart,please have pt come back for additional images,no obvious abnormalities seen

## 2016-12-10 ENCOUNTER — Encounter: Payer: Self-pay | Admitting: Women's Health

## 2016-12-10 ENCOUNTER — Ambulatory Visit (INDEPENDENT_AMBULATORY_CARE_PROVIDER_SITE_OTHER): Payer: BLUE CROSS/BLUE SHIELD | Admitting: Women's Health

## 2016-12-10 VITALS — BP 126/68 | HR 88 | Wt 196.8 lb

## 2016-12-10 DIAGNOSIS — Z3402 Encounter for supervision of normal first pregnancy, second trimester: Secondary | ICD-10-CM

## 2016-12-10 DIAGNOSIS — Z331 Pregnant state, incidental: Secondary | ICD-10-CM

## 2016-12-10 DIAGNOSIS — O99212 Obesity complicating pregnancy, second trimester: Secondary | ICD-10-CM

## 2016-12-10 DIAGNOSIS — Z1389 Encounter for screening for other disorder: Secondary | ICD-10-CM

## 2016-12-10 DIAGNOSIS — O26843 Uterine size-date discrepancy, third trimester: Secondary | ICD-10-CM

## 2016-12-10 DIAGNOSIS — Z3A24 24 weeks gestation of pregnancy: Secondary | ICD-10-CM

## 2016-12-10 DIAGNOSIS — Z363 Encounter for antenatal screening for malformations: Secondary | ICD-10-CM

## 2016-12-10 LAB — POCT URINALYSIS DIPSTICK
Blood, UA: NEGATIVE
GLUCOSE UA: NEGATIVE
KETONES UA: NEGATIVE
Nitrite, UA: NEGATIVE

## 2016-12-10 NOTE — Progress Notes (Signed)
Low-risk OB appointment G1P0 3434w1d Estimated Date of Delivery: 03/31/17 BP 126/68   Pulse 88   Wt 196 lb 12.8 oz (89.3 kg)   LMP 06/17/2016 (Exact Date)   BMI 36.00 kg/m   BP, weight, and urine reviewed.  Refer to obstetrical flow sheet for FH & FHR.  Reports good fm.  Denies regular uc's, lof, vb, or uti s/s. No complaints. Reviewed ptl s/s, fm. Plan:  Continue routine obstetrical care  F/U in 4wks for OB appointment, pn2, and f/u anatomy u/s for limited views

## 2016-12-10 NOTE — Patient Instructions (Addendum)
You will have your sugar test next visit.  Please do not eat or drink anything after midnight the night before you come, not even water.  You will be here for at least two hours.     Call the office [phone]) or [facility] if:  You begin to have strong, frequent contractions  Your water breaks.  Sometimes it is a big gush of fluid, sometimes it is just a trickle that keeps getting your panties wet or running down your legs  You have vaginal bleeding.  It is normal to have a small amount of spotting if your cervix was checked.   You don't feel your baby moving like normal.  If you don't, get you something to eat and drink and lay down and focus on feeling your baby move.   If your baby is still not moving like normal, you should call the office or [facility].  Constipation  Drink plenty of fluid, preferably water, throughout the day  Eat foods high in fiber such as fruits, vegetables, and grains  Exercise, such as walking, is a good way to keep your bowels regular  Drink warm fluids, especially warm prune juice, or decaf coffee  Eat a 1/2 cup of real oatmeal (not instant), 1/2 cup applesauce, and 1/2-1 cup warm prune juice every day  If needed, you may take Colace (docusate sodium) stool softener once or twice a day to help keep the stool soft. If you are pregnant, wait until you are out of your first trimester (12-14 weeks of pregnancy)  If you still are having problems with constipation, you may take Miralax once daily as needed to help keep your bowels regular.  If you are pregnant, wait until you are out of your first trimester (12-14 weeks of pregnancy)     Second Trimester of Pregnancy The second trimester is from week 13 through week 28, months 4 through 6. The second trimester is often a time when you feel your best. Your body has also adjusted to being pregnant, and you begin to feel better physically. Usually, morning sickness has lessened or quit  completely, you may have more energy, and you may have an increase in appetite. The second trimester is also a time when the fetus is growing rapidly. At the end of the sixth month, the fetus is about 9 inches long and weighs about 1 pounds. You will likely begin to feel the baby move (quickening) between 18 and 20 weeks of the pregnancy. BODY CHANGES Your body goes through many changes during pregnancy. The changes vary from woman to woman.   Your weight will continue to increase. You will notice your lower abdomen bulging out.  You may begin to get stretch marks on your hips, abdomen, and breasts.  You may develop headaches that can be relieved by medicines approved by your health care provider.  You may urinate more often because the fetus is pressing on your bladder.  You may develop or continue to have heartburn as a result of your pregnancy.  You may develop constipation because certain hormones are causing the muscles that push waste through your intestines to slow down.  You may develop hemorrhoids or swollen, bulging veins (varicose veins).  You may have back pain because of the weight gain and pregnancy hormones relaxing your joints between the bones in your pelvis and as a result of a shift in weight and the muscles that support your balance.  Your breasts will  to grow and be tender.  Your gums may bleed and may be sensitive to brushing and flossing.  Dark spots or blotches (chloasma, mask of pregnancy) may develop on your face. This will likely fade after the baby is born.  A dark line from your belly button to the pubic area (linea nigra) may appear. This will likely fade after the baby is born.  You may have changes in your hair. These can include thickening of your hair, rapid growth, and changes in texture. Some women also have hair loss during or after pregnancy, or hair that feels dry or thin. Your hair will most likely return to normal after your baby is  born. WHAT TO EXPECT AT YOUR PRENATAL VISITS During a routine prenatal visit:  You will be weighed to make sure you and the fetus are growing normally.  Your blood pressure will be taken.  Your abdomen will be measured to track your baby's growth.  The fetal heartbeat will be listened to.  Any test results from the previous visit will be discussed. Your health care provider may ask you:  How you are feeling.  If you are feeling the baby move.  If you have had any abnormal symptoms, such as leaking fluid, bleeding, severe headaches, or abdominal cramping.  If you have any questions. Other tests that may be performed during your second trimester include:  Blood tests that check for:  Low iron levels (anemia).  Gestational diabetes (between 24 and 28 weeks).  Rh antibodies.  Urine tests to check for infections, diabetes, or protein in the urine.  An ultrasound to confirm the proper growth and development of the baby.  An amniocentesis to check for possible genetic problems.  Fetal screens for spina bifida and Down syndrome. HOME CARE INSTRUCTIONS   Avoid all smoking, herbs, alcohol, and unprescribed drugs. These chemicals affect the formation and growth of the baby.  Follow your health care provider's instructions regarding medicine use. There are medicines that are either safe or unsafe to take during pregnancy.  Exercise only as directed by your health care provider. Experiencing uterine cramps is a good sign to stop exercising.  Continue to eat regular, healthy meals.  Wear a good support bra for breast tenderness.  Do not use hot tubs, steam rooms, or saunas.  Wear your seat belt at all times when driving.  Avoid raw meat, uncooked cheese, cat litter boxes, and soil used by cats. These carry germs that can cause birth defects in the baby.  Take your prenatal vitamins.  Try taking a stool softener (if your health care provider approves) if you develop  constipation. Eat more high-fiber foods, such as fresh vegetables or fruit and whole grains. Drink plenty of fluids to keep your urine clear or pale yellow.  Take warm sitz baths to soothe any pain or discomfort caused by hemorrhoids. Use hemorrhoid cream if your health care provider approves.  If you develop varicose veins, wear support hose. Elevate your feet for 15 minutes, 3-4 times a day. Limit salt in your diet.  Avoid heavy lifting, wear low heel shoes, and practice good posture.  Rest with your legs elevated if you have leg cramps or low back pain.  Visit your dentist if you have not gone yet during your pregnancy. Use a soft toothbrush to brush your teeth and be gentle when you floss.  A sexual relationship may be continued unless your health care provider directs you otherwise.  Continue to go to all your   prenatal visits as directed by your health care provider. SEEK MEDICAL CARE IF:   You have dizziness.  You have mild pelvic cramps, pelvic pressure, or nagging pain in the abdominal area.  You have persistent nausea, vomiting, or diarrhea.  You have a bad smelling vaginal discharge.  You have pain with urination. SEEK IMMEDIATE MEDICAL CARE IF:   You have a fever.  You are leaking fluid from your vagina.  You have spotting or bleeding from your vagina.  You have severe abdominal cramping or pain.  You have rapid weight gain or loss.  You have shortness of breath with chest pain.  You notice sudden or extreme swelling of your face, hands, ankles, feet, or legs.  You have not felt your baby move in over an hour.  You have severe headaches that do not go away with medicine.  You have vision changes. Document Released: 10/05/2001 Document Revised: 10/16/2013 Document Reviewed: 12/12/2012 ExitCare Patient Information 2015 [employer]. This information is not intended to replace advice given to you by your health care provider. Make sure you discuss any  questions you have with your health care provider.     

## 2017-01-10 ENCOUNTER — Ambulatory Visit (INDEPENDENT_AMBULATORY_CARE_PROVIDER_SITE_OTHER): Payer: BLUE CROSS/BLUE SHIELD | Admitting: Women's Health

## 2017-01-10 ENCOUNTER — Other Ambulatory Visit: Payer: BLUE CROSS/BLUE SHIELD

## 2017-01-10 ENCOUNTER — Ambulatory Visit (INDEPENDENT_AMBULATORY_CARE_PROVIDER_SITE_OTHER): Payer: BLUE CROSS/BLUE SHIELD

## 2017-01-10 ENCOUNTER — Encounter: Payer: Self-pay | Admitting: Women's Health

## 2017-01-10 VITALS — BP 100/60 | HR 72 | Wt 203.8 lb

## 2017-01-10 DIAGNOSIS — Z3402 Encounter for supervision of normal first pregnancy, second trimester: Secondary | ICD-10-CM | POA: Diagnosis not present

## 2017-01-10 DIAGNOSIS — Z363 Encounter for antenatal screening for malformations: Secondary | ICD-10-CM

## 2017-01-10 DIAGNOSIS — Z331 Pregnant state, incidental: Secondary | ICD-10-CM

## 2017-01-10 DIAGNOSIS — O26843 Uterine size-date discrepancy, third trimester: Secondary | ICD-10-CM

## 2017-01-10 DIAGNOSIS — Z1389 Encounter for screening for other disorder: Secondary | ICD-10-CM

## 2017-01-10 DIAGNOSIS — Z131 Encounter for screening for diabetes mellitus: Secondary | ICD-10-CM | POA: Diagnosis not present

## 2017-01-10 DIAGNOSIS — Z3403 Encounter for supervision of normal first pregnancy, third trimester: Secondary | ICD-10-CM

## 2017-01-10 LAB — POCT URINALYSIS DIPSTICK
Glucose, UA: NEGATIVE
Ketones, UA: NEGATIVE
Leukocytes, UA: NEGATIVE
Nitrite, UA: NEGATIVE
Protein, UA: NEGATIVE
RBC UA: NEGATIVE

## 2017-01-10 NOTE — Patient Instructions (Signed)

## 2017-01-10 NOTE — Progress Notes (Signed)
Low-risk OB appointment G1P0 8632w4d Estimated Date of Delivery: 03/31/17 BP 100/60   Pulse 72   Wt 203 lb 12.8 oz (92.4 kg)   LMP 06/17/2016 (Exact Date)   BMI 37.28 kg/m   BP, weight, and urine reviewed.  Refer to obstetrical flow sheet for FH & FHR.  Reports good fm.  Denies regular uc's, lof, vb, or uti s/s. No complaints. Reviewed today's normal f/u u/s for limited views anatomy, all seen today and normal. Discussed ptl s/s, fkc. Recommended Tdap at HD/PCP per CDC guidelines.  Plan:  Continue routine obstetrical care  F/U in 4wks for OB appointment  PN2 today

## 2017-01-10 NOTE — Progress Notes (Signed)
US 28+4 wks,cephalic,cx 3.5 cm,ant pl gr 1,normal ov's bilat,anatomy of heart complete,no obvious abnormalities seen,fhr 161 bpm,afi 17.1 cm,efw 1324 g 59%

## 2017-01-11 ENCOUNTER — Other Ambulatory Visit: Payer: Self-pay | Admitting: Women's Health

## 2017-01-11 LAB — CBC
HEMATOCRIT: 34.7 % (ref 34.0–46.6)
HEMOGLOBIN: 11.9 g/dL (ref 11.1–15.9)
MCH: 30.8 pg (ref 26.6–33.0)
MCHC: 34.3 g/dL (ref 31.5–35.7)
MCV: 90 fL (ref 79–97)
Platelets: 277 10*3/uL (ref 150–379)
RBC: 3.86 x10E6/uL (ref 3.77–5.28)
RDW: 14.2 % (ref 12.3–15.4)
WBC: 10.3 10*3/uL (ref 3.4–10.8)

## 2017-01-11 LAB — GLUCOSE TOLERANCE, 2 HOURS W/ 1HR
GLUCOSE, 1 HOUR: 129 mg/dL (ref 65–179)
Glucose, 2 hour: 110 mg/dL (ref 65–152)
Glucose, Fasting: 85 mg/dL (ref 65–91)

## 2017-01-11 LAB — HIV ANTIBODY (ROUTINE TESTING W REFLEX): HIV SCREEN 4TH GENERATION: NONREACTIVE

## 2017-01-11 LAB — RPR: RPR Ser Ql: NONREACTIVE

## 2017-01-11 LAB — ANTIBODY SCREEN: ANTIBODY SCREEN: NEGATIVE

## 2017-02-02 ENCOUNTER — Ambulatory Visit (INDEPENDENT_AMBULATORY_CARE_PROVIDER_SITE_OTHER): Payer: BLUE CROSS/BLUE SHIELD | Admitting: Obstetrics and Gynecology

## 2017-02-02 ENCOUNTER — Encounter: Payer: Self-pay | Admitting: Obstetrics and Gynecology

## 2017-02-02 VITALS — BP 128/68 | HR 90 | Wt 208.2 lb

## 2017-02-02 DIAGNOSIS — Z3403 Encounter for supervision of normal first pregnancy, third trimester: Secondary | ICD-10-CM

## 2017-02-02 DIAGNOSIS — Z1389 Encounter for screening for other disorder: Secondary | ICD-10-CM

## 2017-02-02 DIAGNOSIS — Z331 Pregnant state, incidental: Secondary | ICD-10-CM

## 2017-02-02 LAB — POCT URINALYSIS DIPSTICK
Glucose, UA: NEGATIVE
KETONES UA: NEGATIVE
Nitrite, UA: NEGATIVE
PROTEIN UA: NEGATIVE
RBC UA: NEGATIVE

## 2017-02-02 NOTE — Progress Notes (Signed)
G1P0  Estimated Date of Delivery: 03/31/17 LROB [redacted]w[redacted]d  Chief Complaint  Patient presents with  . work in ob    chest congestion x 4-5 days  ____  Patient complaints: chest congestion and a cough for the last 4-5 days. She reports this causes her to have abdominal discomfort due to the pressure. She reports a sore throat.  Patient reports   good fetal movement,                           denies any bleeding , rupture of membranes,or regular contractions.  Blood pressure 128/68, pulse 90, weight 208 lb 3.2 oz (94.4 kg), last menstrual period 06/17/2016.   Urine results:notable for trace leukocytes  refer to the ob flow sheet for FH and FHR, ,                          Physical Examination: General appearance - alert, well appearing, and in no distress                                      Abdomen - FH 34 ,                                                         -FHR 159                                                         soft, nontender, nondistended, no masses or organomegaly                                      Pelvic - examination not indicated                                            Questions were answered. Assessment:  1. LROB G1P0 @ [redacted]w[redacted]d, Estimated Date of Delivery: 03/31/17 2. Viral bronchitis   Plan:   1. Continued routine obstetrical care,  2. Mucinex DM  F/u in 2 weeks for LROB   By signing my name below, I, Sonum Patel, attest that this documentation has been prepared under the direction and in the presence of Tilda Burrow, MD. Electronically Signed: Sonum Patel, Neurosurgeon. 02/02/17. 10:04 AM.  .jfsw I personally performed the services described in this documentation, which was SCRIBED in my presence. The recorded information has been reviewed and considered accurate. It has been edited as necessary during review. Tilda Burrow, MD

## 2017-02-07 ENCOUNTER — Encounter: Payer: BLUE CROSS/BLUE SHIELD | Admitting: Women's Health

## 2017-02-16 ENCOUNTER — Encounter: Payer: BLUE CROSS/BLUE SHIELD | Admitting: Advanced Practice Midwife

## 2017-02-18 ENCOUNTER — Encounter: Payer: BLUE CROSS/BLUE SHIELD | Admitting: Women's Health

## 2017-02-18 ENCOUNTER — Ambulatory Visit (INDEPENDENT_AMBULATORY_CARE_PROVIDER_SITE_OTHER): Payer: BLUE CROSS/BLUE SHIELD | Admitting: Women's Health

## 2017-02-18 ENCOUNTER — Encounter: Payer: Self-pay | Admitting: Women's Health

## 2017-02-18 VITALS — BP 124/70 | HR 105 | Wt 211.0 lb

## 2017-02-18 DIAGNOSIS — Z1389 Encounter for screening for other disorder: Secondary | ICD-10-CM

## 2017-02-18 DIAGNOSIS — Z3403 Encounter for supervision of normal first pregnancy, third trimester: Secondary | ICD-10-CM

## 2017-02-18 DIAGNOSIS — Z331 Pregnant state, incidental: Secondary | ICD-10-CM

## 2017-02-18 LAB — POCT URINALYSIS DIPSTICK
Glucose, UA: NEGATIVE
KETONES UA: NEGATIVE
Leukocytes, UA: NEGATIVE
Nitrite, UA: NEGATIVE
Protein, UA: NEGATIVE
RBC UA: NEGATIVE

## 2017-02-18 NOTE — Patient Instructions (Signed)
Call the office [phone]) or go to [facility] if:  You begin to have strong, frequent contractions  Your water breaks.  Sometimes it is a big gush of fluid, sometimes it is just a trickle that keeps getting your panties wet or running down your legs  You have vaginal bleeding.  It is normal to have a small amount of spotting if your cervix was checked.   You don't feel your baby moving like normal.  If you don't, get you something to eat and drink and lay down and focus on feeling your baby move.  You should feel at least 10 movements in 2 hours.  If you don't, you should call the office or go to [facility].     Preterm Labor and Birth Information The normal length of a pregnancy is 39-41 weeks. Preterm labor is when labor starts before 37 completed weeks of pregnancy. What are the risk factors for preterm labor? Preterm labor is more likely to occur in women who:  Have certain infections during pregnancy such as a bladder infection, sexually transmitted infection, or infection inside the uterus (chorioamnionitis).  Have a shorter-than-normal cervix.  Have gone into preterm labor before.  Have had surgery on their cervix.  Are younger than age 17 or older than age 35.  Are African American.  Are pregnant with twins or multiple babies (multiple gestation).  Take street drugs or smoke while pregnant.  Do not gain enough weight while pregnant.  Became pregnant shortly after having been pregnant. What are the symptoms of preterm labor? Symptoms of preterm labor include:  Cramps similar to those that can happen during a menstrual period. The cramps may happen with diarrhea.  Pain in the abdomen or lower back.  Regular uterine contractions that may feel like tightening of the abdomen.  A feeling of increased pressure in the pelvis.  Increased watery or bloody mucus discharge from the vagina.  Water breaking (ruptured amniotic sac). Why is it important to  recognize signs of preterm labor? It is important to recognize signs of preterm labor because babies who are born prematurely may not be fully developed. This can put them at an increased risk for:  Long-term (chronic) heart and lung problems.  Difficulty immediately after birth with regulating body systems, including blood sugar, body temperature, heart rate, and breathing rate.  Bleeding in the brain.  Cerebral palsy.  Learning difficulties.  Death. These risks are highest for babies who are born before 34 weeks of pregnancy. How is preterm labor treated? Treatment depends on the length of your pregnancy, your condition, and the health of your baby. It may involve:  Having a stitch (suture) placed in your cervix to prevent your cervix from opening too early (cerclage).  Taking or being given medicines, such as:  Hormone medicines. These may be given early in pregnancy to help support the pregnancy.  Medicine to stop contractions.  Medicines to help mature the baby's lungs. These may be prescribed if the risk of delivery is high.  Medicines to prevent your baby from developing cerebral palsy. If the labor happens before 34 weeks of pregnancy, you may need to stay in the hospital. What should I do if I think I am in preterm labor? If you think that you are going into preterm labor, call your health care provider right away. How can I prevent preterm labor in future pregnancies? To increase your chance of having a full-term pregnancy:  Do not use any tobacco products, such   as cigarettes, chewing tobacco, and e-cigarettes. If you need help quitting, ask your health care provider.  Do not use street drugs or medicines that have not been prescribed to you during your pregnancy.  Talk with your health care provider before taking any herbal supplements, even if you have been taking them regularly.  Make sure you gain a healthy amount of weight during your pregnancy.  Watch for  infection. If you think that you might have an infection, get it checked right away.  Make sure to tell your health care provider if you have gone into preterm labor before. This information is not intended to replace advice given to you by your health care provider. Make sure you discuss any questions you have with your health care provider. Document Released: 01/01/2004 Document Revised: 03/23/2016 Document Reviewed: 03/03/2016 Elsevier Interactive Patient Education  2017 [employer]  

## 2017-02-18 NOTE — Progress Notes (Signed)
Low-risk OB appointment G1P0 [redacted]w[redacted]d Estimated Date of Delivery: 03/31/17 BP 124/70   Pulse (!) 105   Wt 211 lb (95.7 kg)   LMP 06/17/2016 (Exact Date)   BMI 38.59 kg/m   BP, weight, and urine reviewed.  Refer to obstetrical flow sheet for FH & FHR.  Reports good fm.  Denies regular uc's, lof, vb, or uti s/s. No complaints. Reviewed ptl s/s, fkc. Plan:  Continue routine obstetrical care  F/U in 2wks for OB appointment

## 2017-03-07 ENCOUNTER — Encounter: Payer: Self-pay | Admitting: Obstetrics & Gynecology

## 2017-03-07 ENCOUNTER — Ambulatory Visit (INDEPENDENT_AMBULATORY_CARE_PROVIDER_SITE_OTHER): Payer: BLUE CROSS/BLUE SHIELD | Admitting: Obstetrics & Gynecology

## 2017-03-07 VITALS — BP 120/76 | HR 96 | Wt 214.0 lb

## 2017-03-07 DIAGNOSIS — Z3403 Encounter for supervision of normal first pregnancy, third trimester: Secondary | ICD-10-CM | POA: Diagnosis not present

## 2017-03-07 DIAGNOSIS — Z3A36 36 weeks gestation of pregnancy: Secondary | ICD-10-CM

## 2017-03-07 DIAGNOSIS — Z331 Pregnant state, incidental: Secondary | ICD-10-CM | POA: Diagnosis not present

## 2017-03-07 DIAGNOSIS — Z1389 Encounter for screening for other disorder: Secondary | ICD-10-CM | POA: Diagnosis not present

## 2017-03-07 LAB — POCT URINALYSIS DIPSTICK
Blood, UA: NEGATIVE
Glucose, UA: NEGATIVE
KETONES UA: NEGATIVE
NITRITE UA: NEGATIVE
PROTEIN UA: NEGATIVE

## 2017-03-07 LAB — OB RESULTS CONSOLE GBS: GBS: POSITIVE

## 2017-03-07 NOTE — Progress Notes (Signed)
G1P0 614w4d Estimated Date of Delivery: 03/31/17  Blood pressure 120/76, pulse 96, weight 214 lb (97.1 kg), last menstrual period 06/17/2016.   BP weight and urine results all reviewed and noted.  Please refer to the obstetrical flow sheet for the fundal height and fetal heart rate documentation:  Patient reports good fetal movement, denies any bleeding and no rupture of membranes symptoms or regular contractions. Patient is without complaints. All questions were answered.  Orders Placed This Encounter  Procedures  . GC/Chlamydia Probe Amp  . Culture, beta strep (group b only)  . POCT urinalysis dipstick    Plan:  Continued routine obstetrical care, GBS culture done today  2/th/-3/vtx  Return in about 1 week (around 03/14/2017) for LROB.

## 2017-03-09 LAB — GC/CHLAMYDIA PROBE AMP
CHLAMYDIA, DNA PROBE: NEGATIVE
Neisseria gonorrhoeae by PCR: NEGATIVE

## 2017-03-10 LAB — CULTURE, BETA STREP (GROUP B ONLY): STREP GP B CULTURE: POSITIVE — AB

## 2017-03-14 ENCOUNTER — Encounter: Payer: Self-pay | Admitting: Women's Health

## 2017-03-14 ENCOUNTER — Ambulatory Visit (INDEPENDENT_AMBULATORY_CARE_PROVIDER_SITE_OTHER): Payer: BLUE CROSS/BLUE SHIELD | Admitting: Women's Health

## 2017-03-14 VITALS — BP 132/78 | HR 96 | Wt 218.0 lb

## 2017-03-14 DIAGNOSIS — Z1389 Encounter for screening for other disorder: Secondary | ICD-10-CM

## 2017-03-14 DIAGNOSIS — Z331 Pregnant state, incidental: Secondary | ICD-10-CM

## 2017-03-14 DIAGNOSIS — Z3403 Encounter for supervision of normal first pregnancy, third trimester: Secondary | ICD-10-CM

## 2017-03-14 LAB — POCT URINALYSIS DIPSTICK
Blood, UA: NEGATIVE
GLUCOSE UA: NEGATIVE
KETONES UA: NEGATIVE
Leukocytes, UA: NEGATIVE
Nitrite, UA: NEGATIVE

## 2017-03-14 NOTE — Patient Instructions (Signed)
Call the office ([phone]) or go to [facility] if:  You begin to have strong, frequent contractions  Your water breaks.  Sometimes it is a big gush of fluid, sometimes it is just a trickle that keeps getting your panties wet or running down your legs  You have vaginal bleeding.  It is normal to have a small amount of spotting if your cervix was checked.   You don't feel your baby moving like normal.  If you don't, get you something to eat and drink and lay down and focus on feeling your baby move.  You should feel at least 10 movements in 2 hours.  If you don't, you should call the office or go to [facility].     Braxton Hicks Contractions Contractions of the uterus can occur throughout pregnancy, but they are not always a sign that you are in labor. You may have practice contractions called Braxton Hicks contractions. These false labor contractions are sometimes confused with true labor. What are Braxton Hicks contractions? Braxton Hicks contractions are tightening movements that occur in the muscles of the uterus before labor. Unlike true labor contractions, these contractions do not result in opening (dilation) and thinning of the cervix. Toward the end of pregnancy (32-34 weeks), Braxton Hicks contractions can happen more often and may become stronger. These contractions are sometimes difficult to tell apart from true labor because they can be very uncomfortable. You should not feel embarrassed if you go to the hospital with false labor. Sometimes, the only way to tell if you are in true labor is for your health care provider to look for changes in the cervix. The health care provider will do a physical exam and may monitor your contractions. If you are not in true labor, the exam should show that your cervix is not dilating and your water has not broken. If there are no prenatal problems or other health problems associated with your pregnancy, it is completely safe for you to be sent  home with false labor. You may continue to have Braxton Hicks contractions until you go into true labor. How can I tell the difference between true labor and false labor?  Differences ? False labor ? Contractions last 30-70 seconds.: Contractions are usually shorter and not as strong as true labor contractions. ? Contractions become very regular.: Contractions are usually irregular. ? Discomfort is usually felt in the top of the uterus, and it spreads to the lower abdomen and low back.: Contractions are often felt in the front of the lower abdomen and in the groin. ? Contractions do not go away with walking.: Contractions may go away when you walk around or change positions while lying down. ? Contractions usually become more intense and increase in frequency.: Contractions get weaker and are shorter-lasting as time goes on. ? The cervix dilates and gets thinner.: The cervix usually does not dilate or become thin. Follow these instructions at home:  Take over-the-counter and prescription medicines only as told by your health care provider.  Keep up with your usual exercises and follow other instructions from your health care provider.  Eat and drink lightly if you think you are going into labor.  If Braxton Hicks contractions are making you uncomfortable: ? Change your position from lying down or resting to walking, or change from walking to resting. ? Sit and rest in a tub of warm water. ? Drink enough fluid to keep your urine clear or pale yellow. Dehydration may cause these contractions. ?   Do slow and deep breathing several times an hour.  Keep all follow-up prenatal visits as told by your health care provider. This is important. Contact a health care provider if:  You have a fever.  You have continuous pain in your abdomen. Get help right away if:  Your contractions become stronger, more regular, and closer together.  You have fluid leaking or gushing from your vagina.  You  pass blood-tinged mucus (bloody show).  You have bleeding from your vagina.  You have low back pain that you never had before.  You feel your baby's head pushing down and causing pelvic pressure.  Your baby is not moving inside you as much as it used to. Summary  Contractions that occur before labor are called Braxton Hicks contractions, false labor, or practice contractions.  Braxton Hicks contractions are usually shorter, weaker, farther apart, and less regular than true labor contractions. True labor contractions usually become progressively stronger and regular and they become more frequent.  Manage discomfort from Braxton Hicks contractions by changing position, resting in a warm bath, drinking plenty of water, or practicing deep breathing. This information is not intended to replace advice given to you by your health care provider. Make sure you discuss any questions you have with your health care provider. Document Released: 10/11/2005 Document Revised: 08/30/2016 Document Reviewed: 08/30/2016 Elsevier Interactive Patient Education  2017 [employer] Inc.  

## 2017-03-14 NOTE — Progress Notes (Signed)
Low-risk OB appointment G1P0 2922w4d Estimated Date of Delivery: 03/31/17 BP 132/78   Pulse 96   Wt 218 lb (98.9 kg)   LMP 06/17/2016 (Exact Date)   BMI 39.87 kg/m   BP, weight, and urine reviewed.  Refer to obstetrical flow sheet for FH & FHR.  Reports good fm.  Denies regular uc's, lof, vb, or uti s/s. Some swelling in legs.  SVE per request: 3/50/-3, vtx 1+ BLE edema, no clonus, DTRs 2+ Reviewed labor s/s, pre-e s/s, fkc, gbs+. Plan:  Continue routine obstetrical care  F/U in 1wk for OB appointment

## 2017-03-23 ENCOUNTER — Ambulatory Visit (INDEPENDENT_AMBULATORY_CARE_PROVIDER_SITE_OTHER): Payer: BLUE CROSS/BLUE SHIELD | Admitting: Advanced Practice Midwife

## 2017-03-23 ENCOUNTER — Encounter: Payer: Self-pay | Admitting: Advanced Practice Midwife

## 2017-03-23 VITALS — BP 120/70 | HR 80 | Wt 214.0 lb

## 2017-03-23 DIAGNOSIS — Z331 Pregnant state, incidental: Secondary | ICD-10-CM

## 2017-03-23 DIAGNOSIS — Z3403 Encounter for supervision of normal first pregnancy, third trimester: Secondary | ICD-10-CM

## 2017-03-23 DIAGNOSIS — Z1389 Encounter for screening for other disorder: Secondary | ICD-10-CM

## 2017-03-23 LAB — POCT URINALYSIS DIPSTICK
Blood, UA: NEGATIVE
GLUCOSE UA: NEGATIVE
KETONES UA: NEGATIVE
Leukocytes, UA: NEGATIVE
NITRITE UA: NEGATIVE
Protein, UA: NEGATIVE

## 2017-03-23 NOTE — Progress Notes (Signed)
G1P0 6069w6d Estimated Date of Delivery: 03/31/17  Blood pressure 120/70, pulse 80, weight 214 lb (97.1 kg), last menstrual period 06/17/2016.   BP weight and urine results all reviewed and noted.  Please refer to the obstetrical flow sheet for the fundal height and fetal heart rate documentation:  Patient reports good fetal movement, denies any bleeding and no rupture of membranes symptoms or regular contractions. Patient is without complaints. All questions were answered.  Orders Placed This Encounter  Procedures  . POCT urinalysis dipstick    Plan:  Continued routine obstetrical care,   Return in about 1 week (around 03/30/2017) for LROB.

## 2017-03-23 NOTE — Patient Instructions (Signed)

## 2017-03-24 ENCOUNTER — Encounter: Payer: Self-pay | Admitting: Advanced Practice Midwife

## 2017-03-24 ENCOUNTER — Ambulatory Visit (INDEPENDENT_AMBULATORY_CARE_PROVIDER_SITE_OTHER): Payer: BLUE CROSS/BLUE SHIELD | Admitting: Advanced Practice Midwife

## 2017-03-24 VITALS — BP 124/78 | HR 103 | Wt 216.0 lb

## 2017-03-24 DIAGNOSIS — Z3403 Encounter for supervision of normal first pregnancy, third trimester: Secondary | ICD-10-CM

## 2017-03-24 DIAGNOSIS — Z331 Pregnant state, incidental: Secondary | ICD-10-CM

## 2017-03-24 DIAGNOSIS — Z1389 Encounter for screening for other disorder: Secondary | ICD-10-CM

## 2017-03-24 LAB — POCT URINALYSIS DIPSTICK
Glucose, UA: NEGATIVE
KETONES UA: NEGATIVE
Leukocytes, UA: NEGATIVE
Nitrite, UA: NEGATIVE
Protein, UA: NEGATIVE

## 2017-03-24 NOTE — Patient Instructions (Signed)

## 2017-03-24 NOTE — Progress Notes (Signed)
Ctx q 2-3 mi nutres per pt all day.  No change in cx at all since yesterday.  _ Fm, very active baby.  Labor precautions discussed.

## 2017-03-28 ENCOUNTER — Ambulatory Visit (INDEPENDENT_AMBULATORY_CARE_PROVIDER_SITE_OTHER): Payer: BLUE CROSS/BLUE SHIELD | Admitting: Obstetrics and Gynecology

## 2017-03-28 ENCOUNTER — Telehealth: Payer: Self-pay | Admitting: Women's Health

## 2017-03-28 ENCOUNTER — Encounter: Payer: Self-pay | Admitting: Obstetrics and Gynecology

## 2017-03-28 VITALS — BP 132/82 | HR 62 | Wt 218.8 lb

## 2017-03-28 DIAGNOSIS — Z1389 Encounter for screening for other disorder: Secondary | ICD-10-CM

## 2017-03-28 DIAGNOSIS — Z3403 Encounter for supervision of normal first pregnancy, third trimester: Secondary | ICD-10-CM

## 2017-03-28 DIAGNOSIS — Z331 Pregnant state, incidental: Secondary | ICD-10-CM

## 2017-03-28 LAB — POCT URINALYSIS DIPSTICK
GLUCOSE UA: NEGATIVE
KETONES UA: NEGATIVE
Leukocytes, UA: NEGATIVE
NITRITE UA: NEGATIVE
PROTEIN UA: NEGATIVE

## 2017-03-28 NOTE — Progress Notes (Signed)
Kelly Mahoney is a [age] y.o. female CC: spotting and mucus d/c since awakening  G1P0  Estimated Date of Delivery: 03/31/17 LROB 5862w4d  Chief Complaint  Patient presents with   w/i    decreased fetal movement, bleeding  ____  Patient presents today as a work-in complaining of mild bright red mucous-like blood which she first noticed ~0300 this AM when she wiped. She states she's seen blood everytime she has wiped since then. Patient reports a slight decrease in fetal movement. Pt denies rupture of membranes,or regular contractions.  Blood pressure 132/82, pulse 62, weight 218 lb 12.8 oz (99.2 kg), last menstrual period 06/17/2016.   Urine results:notable for large blood  refer to the ob flow sheet for FH and FHR,                          Physical Examination: General appearance - alert, well appearing, and in no distress                                      Abdomen   -FHR 140 bpm                                                         soft, nontender                                      Pelvic - VULVA: normal appearing vulva with no masses, tenderness or lesions, VAGINA: light spotting, normal changes, CERVIX: 4 cm dilated, 50% effaced, -2 vertex                                         Questions were answered. Assessment: LROB G1P0 @ 1362w4d; Reactive NST  Plan:  Continued routine obstetrical care,  F/u in 3 days for scheduled appointment for routine care.   By signing my name below, I, Freida BusmanDiana Omoyeni, attest that this documentation has been prepared under the direction and in the presence of Tilda BurrowJohn V Ferguson, MD . Electronically Signed: Freida Busmaniana Omoyeni, Scribe. 03/28/2017. 12:44 PM. I personally performed the services described in this documentation, which was SCRIBED in my presence. The recorded information has been reviewed and considered accurate. It has been edited as necessary during review. Tilda BurrowFERGUSON,JOHN V, MD

## 2017-03-28 NOTE — Telephone Encounter (Signed)
Patient states she has been having light pink to bright red bleeding throughout the night and has not felt the baby move as much as normal. Advised patient to come in to be seen. Verbalized understanding. Will w/i with Dr Emelda FearFerguson.

## 2017-03-30 ENCOUNTER — Inpatient Hospital Stay (HOSPITAL_COMMUNITY): Payer: 59 | Admitting: Anesthesiology

## 2017-03-30 ENCOUNTER — Inpatient Hospital Stay (HOSPITAL_COMMUNITY)
Admission: AD | Admit: 2017-03-30 | Discharge: 2017-04-01 | DRG: 775 | Disposition: A | Discharge status: Home / Self Care | Payer: 59 | Source: Ambulatory Visit | Attending: Family Medicine | Admitting: Family Medicine

## 2017-03-30 ENCOUNTER — Encounter (HOSPITAL_COMMUNITY): Payer: Self-pay

## 2017-03-30 DIAGNOSIS — Z23 Encounter for immunization: Secondary | ICD-10-CM | POA: Diagnosis not present

## 2017-03-30 DIAGNOSIS — O4292 Full-term premature rupture of membranes, unspecified as to length of time between rupture and onset of labor: Secondary | ICD-10-CM | POA: Diagnosis not present

## 2017-03-30 DIAGNOSIS — Z3A39 39 weeks gestation of pregnancy: Secondary | ICD-10-CM | POA: Diagnosis not present

## 2017-03-30 DIAGNOSIS — Z3403 Encounter for supervision of normal first pregnancy, third trimester: Secondary | ICD-10-CM

## 2017-03-30 DIAGNOSIS — Z6841 Body Mass Index (BMI) 40.0 and over, adult: Secondary | ICD-10-CM

## 2017-03-30 DIAGNOSIS — Z3A40 40 weeks gestation of pregnancy: Secondary | ICD-10-CM | POA: Diagnosis not present

## 2017-03-30 DIAGNOSIS — O99824 Streptococcus B carrier state complicating childbirth: Secondary | ICD-10-CM | POA: Diagnosis present

## 2017-03-30 DIAGNOSIS — O99214 Obesity complicating childbirth: Secondary | ICD-10-CM | POA: Diagnosis not present

## 2017-03-30 LAB — CBC
HCT: 39.4 % (ref 36.0–46.0)
Hemoglobin: 13.6 g/dL (ref 12.0–15.0)
MCH: 29.8 pg (ref 26.0–34.0)
MCHC: 34.5 g/dL (ref 30.0–36.0)
MCV: 86.4 fL (ref 78.0–100.0)
PLATELETS: 237 10*3/uL (ref 150–400)
RBC: 4.56 MIL/uL (ref 3.87–5.11)
RDW: 14.3 % (ref 11.5–15.5)
WBC: 14.6 10*3/uL — ABNORMAL HIGH (ref 4.0–10.5)

## 2017-03-30 LAB — ABO/RH: ABO/RH(D): O POS

## 2017-03-30 LAB — POCT FERN TEST: POCT FERN TEST: POSITIVE

## 2017-03-30 LAB — TYPE AND SCREEN
ABO/RH(D): O POS
Antibody Screen: NEGATIVE

## 2017-03-30 MED ORDER — EPHEDRINE 5 MG/ML INJ
10.0000 mg | INTRAVENOUS | Status: DC | PRN
  Filled 2017-03-30: qty 2

## 2017-03-30 MED ORDER — OXYTOCIN BOLUS FROM INFUSION
500.0000 mL | Freq: Once | INTRAVENOUS | Status: AC
  Administered 2017-03-30: 500 mL via INTRAVENOUS

## 2017-03-30 MED ORDER — LACTATED RINGERS IV SOLN
500.0000 mL | INTRAVENOUS | Status: DC | PRN
  Administered 2017-03-30: 500 mL via INTRAVENOUS

## 2017-03-30 MED ORDER — OXYCODONE-ACETAMINOPHEN 5-325 MG PO TABS: 1.0000 | ORAL_TABLET | ORAL | Status: DC | PRN

## 2017-03-30 MED ORDER — PHENYLEPHRINE 40 MCG/ML (10ML) SYRINGE FOR IV PUSH (FOR BLOOD PRESSURE SUPPORT)
80.0000 ug | PREFILLED_SYRINGE | INTRAVENOUS | Status: DC | PRN
  Filled 2017-03-30: qty 5

## 2017-03-30 MED ORDER — ACETAMINOPHEN 325 MG PO TABS
650.0000 mg | ORAL_TABLET | ORAL | Status: DC | PRN
  Administered 2017-03-30: 650 mg via ORAL

## 2017-03-30 MED ORDER — PHENYLEPHRINE 40 MCG/ML (10ML) SYRINGE FOR IV PUSH (FOR BLOOD PRESSURE SUPPORT)
80.0000 ug | PREFILLED_SYRINGE | INTRAVENOUS | Status: DC | PRN
  Filled 2017-03-30: qty 10
  Filled 2017-03-30: qty 5

## 2017-03-30 MED ORDER — SOD CITRATE-CITRIC ACID 500-334 MG/5ML PO SOLN: 30.0000 mL | ORAL | Status: DC | PRN

## 2017-03-30 MED ORDER — PENICILLIN G POT IN DEXTROSE 60000 UNIT/ML IV SOLN
3.0000 10*6.[IU] | INTRAVENOUS | Status: DC
  Administered 2017-03-30 (×2): 3 10*6.[IU] via INTRAVENOUS
  Filled 2017-03-30 (×6): qty 50

## 2017-03-30 MED ORDER — OXYCODONE-ACETAMINOPHEN 5-325 MG PO TABS: 2.0000 | ORAL_TABLET | ORAL | Status: DC | PRN

## 2017-03-30 MED ORDER — OXYTOCIN 40 UNITS IN LACTATED RINGERS INFUSION - SIMPLE MED
1.0000 m[IU]/min | INTRAVENOUS | Status: DC
  Administered 2017-03-30: 2 m[IU]/min via INTRAVENOUS
  Filled 2017-03-30: qty 1000

## 2017-03-30 MED ORDER — LIDOCAINE HCL (PF) 1 % IJ SOLN
INTRAMUSCULAR | Status: DC | PRN
  Administered 2017-03-30 (×2): 7 mL via EPIDURAL

## 2017-03-30 MED ORDER — PENICILLIN G POTASSIUM 5000000 UNITS IJ SOLR
5.0000 10*6.[IU] | Freq: Once | INTRAMUSCULAR | Status: AC
  Administered 2017-03-30: 5 10*6.[IU] via INTRAVENOUS
  Filled 2017-03-30: qty 5

## 2017-03-30 MED ORDER — OXYTOCIN 40 UNITS IN LACTATED RINGERS INFUSION - SIMPLE MED: 2.5000 [IU]/h | INTRAVENOUS | Status: DC

## 2017-03-30 MED ORDER — TERBUTALINE SULFATE 1 MG/ML IJ SOLN
0.2500 mg | Freq: Once | INTRAMUSCULAR | Status: DC | PRN
  Filled 2017-03-30: qty 1

## 2017-03-30 MED ORDER — ONDANSETRON HCL 4 MG/2ML IJ SOLN
4.0000 mg | Freq: Four times a day (QID) | INTRAMUSCULAR | Status: DC | PRN
  Administered 2017-03-30 (×2): 4 mg via INTRAVENOUS
  Filled 2017-03-30: qty 2

## 2017-03-30 MED ORDER — LACTATED RINGERS IV SOLN
500.0000 mL | Freq: Once | INTRAVENOUS | Status: AC
  Administered 2017-03-30: 500 mL via INTRAVENOUS

## 2017-03-30 MED ORDER — FENTANYL 2.5 MCG/ML BUPIVACAINE 1/10 % EPIDURAL INFUSION (WH - ANES)
14.0000 mL/h | INTRAMUSCULAR | Status: DC | PRN
  Administered 2017-03-30 (×2): 14 mL/h via EPIDURAL
  Filled 2017-03-30 (×2): qty 100

## 2017-03-30 MED ORDER — FLEET ENEMA 7-19 GM/118ML RE ENEM: 1.0000 | ENEMA | RECTAL | Status: DC | PRN

## 2017-03-30 MED ORDER — DIPHENHYDRAMINE HCL 50 MG/ML IJ SOLN: 12.5000 mg | INTRAMUSCULAR | Status: DC | PRN

## 2017-03-30 MED ORDER — LIDOCAINE HCL (PF) 1 % IJ SOLN
30.0000 mL | INTRAMUSCULAR | Status: DC | PRN
  Administered 2017-03-30: 30 mL via SUBCUTANEOUS
  Filled 2017-03-30: qty 30

## 2017-03-30 MED ORDER — LACTATED RINGERS IV SOLN
INTRAVENOUS | Status: DC
  Administered 2017-03-30 (×3): via INTRAVENOUS

## 2017-03-30 NOTE — H&P (Signed)
Kelly Mahoney is a 26 y.o. female presenting for PROM at term. OB History    Gravida Para Term Preterm AB Living   1             SAB TAB Ectopic Multiple Live Births                 Past Medical History:  Diagnosis Date  . Medical history non-contributory    Past Surgical History:  Procedure Laterality Date  . NO PAST SURGERIES     Family History: family history is not on file. Social History:  reports that she has never smoked. She has never used smokeless tobacco. She reports that she does not drink alcohol or use drugs.     Maternal Diabetes: No Genetic Screening: Normal Maternal Ultrasounds/Referrals: Normal Fetal Ultrasounds or other Referrals:  None Maternal Substance Abuse:  No Significant Maternal Medications:  None Significant Maternal Lab Results:  None Other Comments:  None  Review of Systems  Constitutional: Negative for chills and fever.  Gastrointestinal: Positive for abdominal pain.   Maternal Medical History:  Reason for admission: Rupture of membranes.   Contractions: Onset was 3-5 hours ago.    Fetal activity: Perceived fetal activity is normal.   Last perceived fetal movement was within the past hour.    Prenatal complications: no prenatal complications GBS pos  Prenatal Complications - Diabetes: none.    Dilation: 5 Effacement (%): 80 Station: -2 Exam by:: N Druebbisch RN Blood pressure (!) 146/84, pulse 71, temperature 98.2 F (36.8 C), temperature source Oral, resp. rate 16, height 5\' 1"  (1.549 m), weight 98.9 kg (218 lb), last menstrual period 06/17/2016. Maternal Exam:  Uterine Assessment: Contraction strength is mild.  Contraction frequency is regular.   Abdomen: Patient reports no abdominal tenderness. Fetal presentation: vertex  Introitus: Amniotic fluid character: clear.     Fetal Exam Fetal Monitor Review: Mode: ultrasound.   Baseline rate: 150.  Variability: moderate (6-25 bpm).   Pattern: accelerations present and no  decelerations.    Fetal State Assessment: Category I - tracings are normal.     Physical Exam  Constitutional: She is oriented to person, place, and time. She appears well-developed and well-nourished. No distress.  HENT:  Head: Normocephalic and atraumatic.  Neck: Normal range of motion.  Cardiovascular: Normal rate.   Respiratory: Effort normal. No respiratory distress.  GI: Soft. She exhibits no distension. There is no tenderness.  Musculoskeletal: Normal range of motion.  Neurological: She is alert and oriented to person, place, and time.  Skin: Skin is warm and dry.  Psychiatric: She has a normal mood and affect.    Prenatal labs: ABO, Rh: O/Positive/-- (10/23 1517) Antibody: Negative (03/19 0909) Rubella: 1.67 (10/23 1517) RPR: Non Reactive (03/19 0909)  HBsAg: Negative (10/23 1517)  HIV: Non Reactive (03/19 0909)  GBS: Positive (05/14 0000)   Assessment/Plan: 39.[redacted] weeks gestation PROM at term Cat I FHT Admit to BS Expectant mngt-consider Pitocin if no labor in 2-4 hrs PCN for GBS  Anticipate SVD  Kelly Mahoney, CNM 03/30/2017, 9:30 AM

## 2017-03-30 NOTE — Progress Notes (Signed)
Labor Progress Note Kelly Mahoney is a [age] y.o. G1P0 at 1452w6d presented for SROM.  S:  Feeling ctx, epidural just pla  O:  BP 125/66   Pulse 94   Temp 98.9 F (37.2 C)   Resp 18   Ht 5\' 1"  (1.549 m)   Wt 98.9 kg (218 lb)   LMP 06/17/2016 (Exact Date)   SpO2 98%   BMI 41.19 kg/m  EFM: baseline 155 bpm/ mod variability/ + accels/ no decels  Toco: 1-2 SVE: deferred (previously 6cm)  A/P: 26 y.o. G1P0 4652w6d  1. Labor: early active 2. FWB: Cat I 3. Pain: epidural Anticipate labor progression and SVD.  Kelly Mahoney, CNM 4:10 PM

## 2017-03-30 NOTE — Progress Notes (Signed)
Labor Progress Note  Kelly Mahoney is a 26 y.o. G1P0 at 2779w6d  admitted for active labor with SROM.   S: Patient describes constant pressure; says she feels like she will want to push soon  O:  BP 133/80   Pulse 95   Temp 99.7 F (37.6 C) (Axillary)   Resp 18   Ht 5\' 1"  (1.549 m)   Wt 98.9 kg (218 lb)   LMP 06/17/2016 (Exact Date)   SpO2 98%   BMI 41.19 kg/m   Total I/O In: -  Out: 250 [Urine:250]  FHT:  FHR: 170 bpm, variability: moderate,  accelerations:  Abscent,  decelerations:  Present early UC:   Every 2 min SVE:   Dilation: (P) 10 Effacement (%): 100 Station: 0 Exam by:: Veronica Mensah  Pitocin @ 4 mu/min  Labs: Lab Results  Component Value Date   WBC 14.6 (H) 03/30/2017   HGB 13.6 03/30/2017   HCT 39.4 03/30/2017   MCV 86.4 03/30/2017   PLT 237 03/30/2017    Assessment / Plan: 26 y.o. G1P0 4879w6d in active labor. Dilation is complete; labor down with plan to push soon.   Labor: Progressing on pitocin Fetal Wellbeing:  Category II Pain Control:  Epidural Anticipated MOD:  NSVD  Kelly Mahoney, MS3  I agree with the student's note above. I personally checked the patient.   Cleda ClarksElizabeth W. Ivah Girardot, DO  OB Fellow Center [facility]

## 2017-03-30 NOTE — Anesthesia Procedure Notes (Signed)
Epidural

## 2017-03-30 NOTE — MAU Note (Signed)
Pt says she had possible ROM around 0500. Still continues to trickle and has some light bleeding.

## 2017-03-30 NOTE — Progress Notes (Signed)
Labor Progress Note Kelly Mahoney is a 26 y.o. G1P0 at 3182w6d presented for SROM.  S:  Comfortable with epidural. No c/o.  O:  BP 121/64   Pulse 100   Temp 98.9 F (37.2 C)   Resp 18   Ht 5\' 1"  (1.549 m)   Wt 98.9 kg (218 lb)   LMP 06/17/2016 (Exact Date)   SpO2 98%   BMI 41.19 kg/m  EFM: baseline 150 bpm/ mod variability/ + accels/ no decels  Toco: 1-4 SVE: Dilation: 8 (pt requested to be checked) Effacement (%): 90 Station: 0, -1 Presentation: Vertex Exam by:: Engineer, maintenance (IT)Kelly Middleton RN   A/P: 26 y.o. G1P0 8182w6d  1. Labor: protracted 2. FWB: Cat I 3. Pain: epidural IUPC placed. Will start Pitocin augmentation. Anticipate SVD.  Kelly LarryMelanie Oluwanifemi Mahoney, CNM 6:03 PM

## 2017-03-30 NOTE — Anesthesia Procedure Notes (Addendum)
Epidural Patient location during procedure: OB Start time: 03/30/2017 1:29 PM End time: 03/30/2017 1:33 PM  Staffing Anesthesiologist: Leilani AbleHATCHETT, Dequon Schnebly Performed: anesthesiologist   Preanesthetic Checklist Completed: patient identified, surgical consent, pre-op evaluation, timeout performed, IV checked, risks and benefits discussed and monitors and equipment checked  Epidural Patient position: sitting Prep: site prepped and draped and DuraPrep Patient monitoring: continuous pulse ox and blood pressure Approach: midline Injection technique: LOR air  Needle:  Needle type: Tuohy  Needle gauge: 17 G Needle length: 9 cm and 9 Needle insertion depth: 7 cm Catheter type: closed end flexible Catheter size: 19 Gauge Catheter at skin depth: 12 cm Test dose: negative and Other  Assessment Sensory level: T9 Events: blood not aspirated, injection not painful, no injection resistance, negative IV test and no paresthesia  Additional Notes Reason for block:procedure for pain

## 2017-03-30 NOTE — Anesthesia Preprocedure Evaluation (Signed)
Anesthesia Evaluation  Patient identified by MRN, date of birth, ID band Patient awake    Reviewed: Allergy & Precautions, H&P , NPO status , Patient's Chart, lab work & pertinent test results  Airway Mallampati: II  TM Distance: >3 FB Neck ROM: full    Dental no notable dental hx. (+) Teeth Intact   Pulmonary neg pulmonary ROS,    Pulmonary exam normal breath sounds clear to auscultation       Cardiovascular negative cardio ROS Normal cardiovascular exam Rhythm:regular Rate:Normal     Neuro/Psych negative neurological ROS  negative psych ROS   GI/Hepatic negative GI ROS, Neg liver ROS,   Endo/Other  Morbid obesity  Renal/GU negative Renal ROS     Musculoskeletal   Abdominal   Peds  Hematology negative hematology ROS (+)   Anesthesia Other Findings   Reproductive/Obstetrics (+) Pregnancy                             Anesthesia Physical Anesthesia Plan  ASA: III  Anesthesia Plan: Epidural   Post-op Pain Management:    Induction:   PONV Risk Score and Plan:   Airway Management Planned:   Additional Equipment:   Intra-op Plan:   Post-operative Plan:   Informed Consent: I have reviewed the patients History and Physical, chart, labs and discussed the procedure including the risks, benefits and alternatives for the proposed anesthesia with the patient or authorized representative who has indicated his/her understanding and acceptance.     Plan Discussed with:   Anesthesia Plan Comments:         Anesthesia Quick Evaluation

## 2017-03-31 ENCOUNTER — Encounter (HOSPITAL_COMMUNITY): Payer: Self-pay | Admitting: General Practice

## 2017-03-31 ENCOUNTER — Encounter: Payer: BLUE CROSS/BLUE SHIELD | Admitting: Advanced Practice Midwife

## 2017-03-31 DIAGNOSIS — Z3A40 40 weeks gestation of pregnancy: Secondary | ICD-10-CM

## 2017-03-31 LAB — RPR: RPR Ser Ql: NONREACTIVE

## 2017-03-31 MED ORDER — SODIUM CHLORIDE 0.9 % IV SOLN: 250.0000 mL | INTRAVENOUS | Status: DC | PRN

## 2017-03-31 MED ORDER — SIMETHICONE 80 MG PO CHEW: 80.0000 mg | CHEWABLE_TABLET | ORAL | Status: DC | PRN

## 2017-03-31 MED ORDER — IBUPROFEN 600 MG PO TABS
600.0000 mg | ORAL_TABLET | Freq: Four times a day (QID) | ORAL | Status: DC
  Administered 2017-03-31 – 2017-04-01 (×6): 600 mg via ORAL
  Filled 2017-03-31 (×6): qty 1

## 2017-03-31 MED ORDER — SENNOSIDES-DOCUSATE SODIUM 8.6-50 MG PO TABS
2.0000 | ORAL_TABLET | ORAL | Status: DC
  Administered 2017-04-01: 2 via ORAL
  Filled 2017-03-31: qty 2

## 2017-03-31 MED ORDER — PRENATAL MULTIVITAMIN CH
1.0000 | ORAL_TABLET | Freq: Every day | ORAL | Status: DC
  Administered 2017-03-31 – 2017-04-01 (×2): 1 via ORAL
  Filled 2017-03-31 (×2): qty 1

## 2017-03-31 MED ORDER — DIPHENHYDRAMINE HCL 25 MG PO CAPS: 25.0000 mg | ORAL_CAPSULE | Freq: Four times a day (QID) | ORAL | Status: DC | PRN

## 2017-03-31 MED ORDER — TETANUS-DIPHTH-ACELL PERTUSSIS 5-2.5-18.5 LF-MCG/0.5 IM SUSP
0.5000 mL | Freq: Once | INTRAMUSCULAR | Status: AC
  Administered 2017-04-01: 0.5 mL via INTRAMUSCULAR
  Filled 2017-03-31: qty 0.5

## 2017-03-31 MED ORDER — ONDANSETRON HCL 4 MG/2ML IJ SOLN: 4.0000 mg | INTRAMUSCULAR | Status: DC | PRN

## 2017-03-31 MED ORDER — WITCH HAZEL-GLYCERIN EX PADS: 1.0000 "application " | MEDICATED_PAD | CUTANEOUS | Status: DC | PRN

## 2017-03-31 MED ORDER — ONDANSETRON HCL 4 MG PO TABS: 4.0000 mg | ORAL_TABLET | ORAL | Status: DC | PRN

## 2017-03-31 MED ORDER — OXYTOCIN 40 UNITS IN LACTATED RINGERS INFUSION - SIMPLE MED: 2.5000 [IU]/h | INTRAVENOUS | Status: DC | PRN

## 2017-03-31 MED ORDER — ACETAMINOPHEN 325 MG PO TABS: 650.0000 mg | ORAL_TABLET | ORAL | Status: DC | PRN

## 2017-03-31 MED ORDER — BENZOCAINE-MENTHOL 20-0.5 % EX AERO
1.0000 "application " | INHALATION_SPRAY | CUTANEOUS | Status: DC | PRN
  Administered 2017-03-31: 1 via TOPICAL
  Filled 2017-03-31: qty 56

## 2017-03-31 MED ORDER — ZOLPIDEM TARTRATE 5 MG PO TABS: 5.0000 mg | ORAL_TABLET | Freq: Every evening | ORAL | Status: DC | PRN

## 2017-03-31 MED ORDER — IBUPROFEN 600 MG PO TABS
600.0000 mg | ORAL_TABLET | Freq: Four times a day (QID) | ORAL | 0 refills | Status: DC
Start: 2017-03-31 — End: 2019-05-24

## 2017-03-31 MED ORDER — COCONUT OIL OIL: 1.0000 "application " | TOPICAL_OIL | Status: DC | PRN

## 2017-03-31 MED ORDER — DIBUCAINE 1 % RE OINT: 1.0000 "application " | TOPICAL_OINTMENT | RECTAL | Status: DC | PRN

## 2017-03-31 MED ORDER — SODIUM CHLORIDE 0.9% FLUSH: 3.0000 mL | Freq: Two times a day (BID) | INTRAVENOUS | Status: DC

## 2017-03-31 MED ORDER — SODIUM CHLORIDE 0.9% FLUSH: 3.0000 mL | INTRAVENOUS | Status: DC | PRN

## 2017-03-31 NOTE — Discharge Instructions (Signed)

## 2017-03-31 NOTE — Anesthesia Postprocedure Evaluation (Signed)
Anesthesia Post Note  Patient: Kelly Mahoney  Procedure(s) Performed: * No procedures listed *     Patient location during evaluation: Mother Baby Anesthesia Type: Epidural Level of consciousness: awake and alert and oriented Pain management: pain level controlled Vital Signs Assessment: post-procedure vital signs reviewed and stable Respiratory status: spontaneous breathing and nonlabored ventilation Cardiovascular status: stable Postop Assessment: no headache, patient able to bend at knees, no backache, no signs of nausea or vomiting, epidural receding and adequate PO intake Anesthetic complications: no    Last Vitals:  Vitals:   03/31/17 0245 03/31/17 0556  BP: 116/78 118/67  Pulse: (!) 113 82  Resp: 17 18  Temp: 36.9 C 36.9 C    Last Pain:  Vitals:   03/31/17 0556  TempSrc: Oral  PainSc: 5    Pain Goal:                 Land O'LakesMalinova,Ronee Ranganathan Hristova

## 2017-03-31 NOTE — Lactation Note (Signed)
This note was copied from a baby'Mahoney chart. Lactation Consultation Note  Patient Name: Kelly Mahoney Today'Mahoney Date: 03/31/2017 Reason for consult: Initial assessment Breastfeeding consultation services and support information given and reviewed.  This is mom'Mahoney first baby and newborn is 5315 hours old.  Mom states she has had assist by nurse and baby last fed for 15 minutes.  Instructed to feed with any feeding cue and to call for assist prn.  Maternal Data Does the patient have breastfeeding experience prior to this delivery?: No  Feeding    LATCH Score/Interventions                      Lactation Tools Discussed/Used     Consult Status Consult Status: Follow-up Date: 04/01/17 Follow-up type: In-patient    Huston FoleyMOULDEN, Kelly Mahoney 03/31/2017, 3:02 PM

## 2017-03-31 NOTE — Progress Notes (Signed)
Patient is 26 y.o. G1P0 5425w0d admitted for active labor with SROM.  Delivery Note At 11:41 PM a viable female was delivered via Vaginal, Spontaneous Delivery (Presentation: OA). APGAR: 9, 9; weight  4.3kg.   Placenta status: intact. Cord: 3V with the following complications: none. Cord pH: N/A  Anesthesia: epidural  Episiotomy:  N/A Lacerations:  2nd degree; two bilateral 1st degree periurethral Suture Repair: monocryl Est. Blood Loss (mL): 250  Mom to postpartum.  Baby to Couplet care / Skin to Skin.  Upon arrival patient was complete and pushing. She pushed with good maternal effort to deliver a healthy baby girl. Baby delivered without difficulty, was noted to have good tone and placed on maternal abdomen for oral suctioning, drying, and stimulation. Delayed cord clamping performed. Placenta delivered intact with 3V cord. Vaginal canal and perineum was inspected and a 2nd degree and bilateral 1st degree periurethral tears were repaired; now hemostatic. Pitocin was started and uterus massaged until bleeding slowed. Counts of sharps, instruments, and lap pads were all correct.   Lavella Hammocklessandra Usama Harkless, MS3 03/31/2017, 12:09 AM

## 2017-03-31 NOTE — Progress Notes (Signed)
Post Partum Day 1  Subjective:  Kelly Mahoney is a 26 y.o. G1P1001 2131w6d s/p SVD.  No acute events overnight.  Pt denies problems with ambulating, voiding or po intake.  She denies nausea or vomiting.  Pain is well controlled.  She has had flatus. She has not had bowel movement.  Lochia normal.  Plan for birth control is undecided.  Method of Feeding: breast   Objective: BP 118/67 (BP Location: Left Arm)   Pulse 82   Temp 98.4 F (36.9 C) (Oral)   Resp 18   Ht 5\' 1"  (1.549 m)   Wt 98.9 kg (218 lb)   LMP 06/17/2016 (Exact Date)   SpO2 98%   Breastfeeding? Unknown   BMI 41.19 kg/m   Physical Exam:  General: alert, cooperative and no distress Lochia:normal flow Chest: CTAB Heart: RRR no m/r/g Abdomen: +BS, soft, nontender, fundus firm below umbilicus DVT Evaluation: No evidence of DVT seen on physical exam. Extremities: no edema   Recent Labs  03/30/17 1000  HGB 13.6  HCT 39.4   Assessment/Plan:  ASSESSMENT: Kelly Mahoney is a 26 y.o. G1P1001 7531w6d ppd #1 s/p NSVD doing well.   Plan for discharge tomorrow   LOS: 1 day   Thurnell Loselessandra G Micki Cassel 03/31/2017, 7:35 AM

## 2017-03-31 NOTE — Progress Notes (Signed)
Nursing student mentioned to me that patient asked about formula. Upon entering the room, baby is content but rooting and in the arms of a relative. Discussed with patient the need to put baby to the breast 8 times within a 24 hour period or every 3 hours. Also advised that if she is seeing colostrum with hand expression, getting a deep, comfortable latch and then hearing swallows during feedings that she should be ok to keep breastfeeding without supplementing if breastfeeding is what she wants to do. Patient stated she wanted to breastfeed and formula feed. Offered to bring formula to the bedside but patient refused at this time because she was getting ready to breastfeed. Offered to assess baby's latch but patient refused assistance.

## 2017-04-01 ENCOUNTER — Ambulatory Visit: Payer: Self-pay

## 2017-04-01 NOTE — Discharge Summary (Signed)
OB Discharge Summary  Patient Name: Kelly PiaMayra Mahoney DOB: [birth year] MRN: [id number]  Date of admission: 03/30/2017 Delivering MD: Duane LopeEURE, LUTHER H   Date of discharge: 04/01/2017  Admitting diagnosis: 39WKS,WATER BROKE Intrauterine pregnancy: 3361w6d     Secondary diagnosis:Active Problems:   Indication for care or intervention in labor or delivery  Additional problems: + GBS     Discharge diagnosis: Term Pregnancy Delivered                                                                      Augmentation: Pitocin  Complications: None  Hospital course:  Onset of Labor With Vaginal Delivery     26 y.o. yo G1P1001 at 7461w6d was admitted in Latent Labor on 03/30/2017. Patient had an uncomplicated labor course as follows:  Membrane Rupture Time/Date: 5:00 AM ,03/30/2017   Intrapartum Procedures: Episiotomy: None [1]                                         Lacerations:  2nd degree [3];Vaginal [6];Periurethral [8]  Patient had a delivery of a Viable infant. 03/30/2017  Information for the patient's newborn:  Judeth HornOrganis, Girl Rosmary [[id number]][[id number]]  Delivery Method: Vag-Spont    Pateint had an uncomplicated postpartum course.  She is ambulating, tolerating a regular diet, passing flatus, and urinating well. Patient is discharged home in stable condition on 04/01/17.   Physical exam  Vitals:   03/31/17 0245 03/31/17 0556 03/31/17 1859 04/01/17 0638  BP: 116/78 118/67 (!) 117/59 (!) 93/49  Pulse: (!) 113 82 92 76  Resp: 17 18 18 18   Temp: 98.4 F (36.9 C) 98.4 F (36.9 C) 98.4 F (36.9 C) 97.6 F (36.4 C)  TempSrc: Oral Oral Oral Oral  SpO2:      Weight:      Height:       General: alert Lochia: appropriate Uterine Fundus: firm Incision: N/A DVT Evaluation: No evidence of DVT seen on physical exam. Labs: Lab Results  Component Value Date   WBC 14.6 (H) 03/30/2017   HGB 13.6 03/30/2017   HCT 39.4 03/30/2017   MCV 86.4 03/30/2017   PLT 237 03/30/2017   No flowsheet data  found.  Discharge instruction: per After Visit Summary and "Baby and Me Booklet".  After Visit Meds:  Allergies as of 04/01/2017   No Known Allergies     Medication List    TAKE these medications   albuterol 108 (90 Base) MCG/ACT inhaler Commonly known as:  PROVENTIL HFA;VENTOLIN HFA Inhale 1 puff into the lungs every 6 (six) hours as needed for wheezing or shortness of breath.   calcium carbonate 500 MG chewable tablet Commonly known as:  TUMS - dosed in mg elemental calcium Chew 1 tablet by mouth at bedtime as needed for indigestion or heartburn.   ibuprofen 600 MG tablet Commonly known as:  ADVIL,MOTRIN Take 1 tablet (600 mg total) by mouth every 6 (six) hours.   ondansetron 4 MG tablet Commonly known as:  ZOFRAN TAKE 1 TABLET BY MOUTH EVERY 8 HOURS AS NEEDED FOR NAUSEA OR VOMITING       Diet: routine diet  Activity: Advance as tolerated. Pelvic rest for 6 weeks.   Outpatient follow up:4 weeks Follow up Appt:Future Appointments Date Time Provider Department Center  05/13/2017 11:45 AM Cheral Marker, CNM [facility]   Follow up visit: No Follow-up on file.  Postpartum contraception: Undecided  Newborn Data: Live born female  Birth Weight: 9 lb 5 oz (4224 g) APGAR: 9, 9  Baby Feeding: Bottle and Breast Disposition:home with mother   04/01/2017 Allie Bossier, MD

## 2017-04-01 NOTE — Lactation Note (Signed)
This note was copied from a baby's chart. Lactation Consultation Note  Patient Name: Girl Olin PiaMayra Premo Today's Date: 04/01/2017 Reason for consult: Follow-up assessment;Difficult latch Assisted mom with positioning baby using football hold on left breast.  Initially attempted to latch without shield but baby unable to grasp nipple.  24 mm nipple shield applied and baby latched easily.  Observed a good feeding for 15 minutes and baby still nursing actively when I left.  Instructed mom to post pump for 15 minutes and offer any expressed milk back to baby.  Encouraged to call out for assist lprn.  Maternal Data    Feeding Feeding Type: Breast Milk Length of feed: 6 min  LATCH Score/Interventions Latch: Grasps breast easily, tongue down, lips flanged, rhythmical sucking. Intervention(s): Adjust position;Assist with latch;Breast massage;Breast compression  Audible Swallowing: A few with stimulation Intervention(s): Skin to skin;Hand expression;Alternate breast massage  Type of Nipple: Everted at rest and after stimulation  Comfort (Breast/Nipple): Soft / non-tender     Hold (Positioning): Assistance needed to correctly position infant at breast and maintain latch. Intervention(s): Breastfeeding basics reviewed;Support Pillows;Position options;Skin to skin  LATCH Score: 8  Lactation Tools Discussed/Used Tools: Nipple Shields Nipple shield size: 24   Consult Status Consult Status: Follow-up Date: 04/02/17 Follow-up type: In-patient    Huston FoleyMOULDEN, Dilcia Rybarczyk S 04/01/2017, 2:56 PM

## 2017-04-01 NOTE — Lactation Note (Signed)
This note was copied from a baby's chart. Lactation Consultation Note  Patient Name: Kelly Mahoney Today's Date: 04/01/2017 Reason for consult: Follow-up assessment;Difficult latch Baby is 35 hours old and has has very short feeds.  Mom is also supplementing with small amounts of formula.  Assisted with positioning baby skin to skin in football hold.  Baby latched with good breast compression but fell asleep after a few minutes.  Attempted to relatch but baby sucking on her tongue.  Mom states she does this a lot.  A 24 mm nipple shield applied with instructions on correct application.  Baby latched well and nursed for 5 minutes then fell asleep again.  Baby is very sleepy and showing no interest in feeding at this time.  Set up DEBP and initiated pumping.  Mom states she has a DEBP at home and she thinks it may be a medela.  Discussed importance of pumping every 3 hours to establish milk supply.  LC phone number left so mom can call when baby starts to cue.  Maternal Data    Feeding Feeding Type: Breast Fed Length of feed: 6 min  LATCH Score/Interventions Latch: Grasps breast easily, tongue down, lips flanged, rhythmical sucking. Intervention(s): Adjust position;Assist with latch;Breast massage;Breast compression  Audible Swallowing: A few with stimulation Intervention(s): Skin to skin;Hand expression;Alternate breast massage  Type of Nipple: Everted at rest and after stimulation  Comfort (Breast/Nipple): Soft / non-tender     Hold (Positioning): Assistance needed to correctly position infant at breast and maintain latch. Intervention(s): Breastfeeding basics reviewed;Support Pillows;Position options;Skin to skin  LATCH Score: 8  Lactation Tools Discussed/Used Tools: Nipple Shields Nipple shield size: 24   Consult Status      Maddon Horton S 04/01/2017, 11:28 AM

## 2017-04-02 ENCOUNTER — Ambulatory Visit: Payer: Self-pay

## 2017-04-02 NOTE — Lactation Note (Signed)
This note was copied from a baby's chart. Lactation Consultation Note  Baby sleeping in crib with lots of blankets under baby. Provided safe sleep education and undressed baby for feeding. Assisted w/ latching baby but baby sucked a few times and fell back asleep. Attempted feeding with and without NS.  Mother requested smaller #20NS. Provided mother w/ NS and suggest trying always first without NS. Baby did latch briefly without NS.  Encouraged mother. Suggest STS to interest baby in feeding. Encouraged q3 hours pumping if baby does not latch and give volume back to baby at next feeding. If mother latches baby with NS she should post pump 4-6 times a day for 10-15 min. Mom encouraged to feed baby 8-12 times/24 hours and with feeding cues.  Reviewed engorgement care and monitoring voids/stools.    Patient Name: Kelly Mahoney OZHYQ'MToday's Date: 04/02/2017 Reason for consult: Follow-up assessment   Maternal Data    Feeding Feeding Type: Breast Fed  LATCH Score/Interventions                      Lactation Tools Discussed/Used Nipple shield size: 20;24   Consult Status Consult Status: Complete    Hardie PulleyBerkelhammer, Jolaine Fryberger Boschen 04/02/2017, 12:22 PM

## 2017-05-13 ENCOUNTER — Encounter: Payer: Self-pay | Admitting: Women's Health

## 2017-05-13 ENCOUNTER — Ambulatory Visit: Payer: BLUE CROSS/BLUE SHIELD | Admitting: Women's Health

## 2017-05-13 NOTE — Patient Instructions (Signed)
NO SEX UNTIL AFTER YOU GET YOUR BIRTH CONTROL   Constipation  Drink plenty of fluid, preferably water, throughout the day  Eat foods high in fiber such as fruits, vegetables, and grains  Exercise, such as walking, is a good way to keep your bowels regular  Drink warm fluids, especially warm prune juice, or decaf coffee  Eat a 1/2 cup of real oatmeal (not instant), 1/2 cup applesauce, and 1/2-1 cup warm prune juice every day  If needed, you may take Colace (docusate sodium) stool softener once or twice a day to help keep the stool soft. If you are pregnant, wait until you are out of your first trimester (12-14 weeks of pregnancy)  If you still are having problems with constipation, you may take Miralax once daily as needed to help keep your bowels regular.  If you are pregnant, wait until you are out of your first trimester (12-14 weeks of pregnancy)   Etonogestrel implant What is this medicine? ETONOGESTREL (et oh noe JES trel) is a contraceptive (birth control) device. It is used to prevent pregnancy. It can be used for up to 3 years. This medicine may be used for other purposes; ask your health care provider or pharmacist if you have questions. COMMON BRAND NAME(S): Implanon, Nexplanon What should I tell my health care provider before I take this medicine? They need to know if you have any of these conditions: -abnormal vaginal bleeding -blood vessel disease or blood clots -cancer of the breast, cervix, or liver -depression -diabetes -gallbladder disease -headaches -heart disease or recent heart attack -high blood pressure -high cholesterol -kidney disease -liver disease -renal disease -seizures -tobacco smoker -an unusual or allergic reaction to etonogestrel, other hormones, anesthetics or antiseptics, medicines, foods, dyes, or preservatives -pregnant or trying to get pregnant -breast-feeding How should I use this medicine? This device is inserted just under the skin  on the inner side of your upper arm by a health care professional. Talk to your pediatrician regarding the use of this medicine in children. Special care may be needed. Overdosage: If you think you have taken too much of this medicine contact a poison control center or emergency room at once. NOTE: This medicine is only for you. Do not share this medicine with others. What if I miss a dose? This does not apply. What may interact with this medicine? Do not take this medicine with any of the following medications: -amprenavir -bosentan -fosamprenavir This medicine may also interact with the following medications: -barbiturate medicines for inducing sleep or treating seizures -certain medicines for fungal infections like ketoconazole and itraconazole -grapefruit juice -griseofulvin -medicines to treat seizures like carbamazepine, felbamate, oxcarbazepine, phenytoin, topiramate -modafinil -phenylbutazone -rifampin -rufinamide -some medicines to treat HIV infection like atazanavir, indinavir, lopinavir, nelfinavir, tipranavir, ritonavir -St. John's wort This list may not describe all possible interactions. Give your health care provider a list of all the medicines, herbs, non-prescription drugs, or dietary supplements you use. Also tell them if you smoke, drink alcohol, or use illegal drugs. Some items may interact with your medicine. What should I watch for while using this medicine? This product does not protect you against HIV infection (AIDS) or other sexually transmitted diseases. You should be able to feel the implant by pressing your fingertips over the skin where it was inserted. Contact your doctor if you cannot feel the implant, and use a non-hormonal birth control method (such as condoms) until your doctor confirms that the implant is in place. If you feel that  the implant may have broken or become bent while in your arm, contact your healthcare provider. What side effects may I  notice from receiving this medicine? Side effects that you should report to your doctor or health care professional as soon as possible: -allergic reactions like skin rash, itching or hives, swelling of the face, lips, or tongue -breast lumps -changes in emotions or moods -depressed mood -heavy or prolonged menstrual bleeding -pain, irritation, swelling, or bruising at the insertion site -scar at site of insertion -signs of infection at the insertion site such as fever, and skin redness, pain or discharge -signs of pregnancy -signs and symptoms of a blood clot such as breathing problems; changes in vision; chest pain; severe, sudden headache; pain, swelling, warmth in the leg; trouble speaking; sudden numbness or weakness of the face, arm or leg -signs and symptoms of liver injury like dark yellow or brown urine; general ill feeling or flu-like symptoms; light-colored stools; loss of appetite; nausea; right upper belly pain; unusually weak or tired; yellowing of the eyes or skin -unusual vaginal bleeding, discharge -signs and symptoms of a stroke like changes in vision; confusion; trouble speaking or understanding; severe headaches; sudden numbness or weakness of the face, arm or leg; trouble walking; dizziness; loss of balance or coordination Side effects that usually do not require medical attention (report to your doctor or health care professional if they continue or are bothersome): -acne -back pain -breast pain -changes in weight -dizziness -general ill feeling or flu-like symptoms -headache -irregular menstrual bleeding -nausea -sore throat -vaginal irritation or inflammation This list may not describe all possible side effects. Call your doctor for medical advice about side effects. You may report side effects to FDA at 1-800-FDA-1088. Where should I keep my medicine? This drug is given in a hospital or clinic and will not be stored at home. NOTE: This sheet is a summary. It may  not cover all possible information. If you have questions about this medicine, talk to your doctor, pharmacist, or health care provider.  2018 Elsevier/Gold Standard (2016-04-29 11:19:22)

## 2017-05-13 NOTE — Progress Notes (Signed)
Subjective:    Kelly Mahoney is a 26 y.o. [id number] Hispanic female who presents for a postpartum visit. She is 6 weeks postpartum following a spontaneous vaginal delivery at 40 gestational weeks. Anesthesia: epidural. I have fully reviewed the prenatal and intrapartum course. Postpartum course has been ucnomp. Baby's course has been uncomplicated. Baby is feeding by breast & bottle. Bleeding no bleeding. Bowel function is some constipation. Bladder function is normal. Patient is not sexually active. Last sexual activity: prior to birth of baby. Contraception method is wants nexplanon. Postpartum depression screening: negative. Score 0.  Last pap 08/16/16 and was neg.  The following portions of the patient's history were reviewed and updated as appropriate: allergies, current medications, past medical history, past surgical history and problem list.  Review of Systems Pertinent items are noted in HPI.   Vitals:   05/13/17 1200  BP: 122/74  Pulse: 78  Weight: 192 lb (87.1 kg)   Patient's last menstrual period was 06/17/2016 (exact date).  Objective:   General:  alert, cooperative and no distress   Breasts:  deferred, no complaints  Lungs: clear to auscultation bilaterally  Heart:  regular rate and rhythm  Abdomen: soft, nontender   Vulva: normal  Vagina: normal vagina  Cervix:  closed  Corpus: Well-involuted  Adnexa:  Non-palpable  Rectal Exam: No hemorrhoids        Assessment:   Postpartum exam 6 wks s/p SVB Breast & bottlefeeding Depression screening Contraception counseling   Plan:  Contraception: abstinence until nexplanon Follow up in: next week  for nexplanon insertion, or earlier if needed  Marge DuncansBooker, Kelly Mahoney CNM, [facility] 05/13/2017 12:14 PM

## 2017-05-24 ENCOUNTER — Ambulatory Visit: Payer: BLUE CROSS/BLUE SHIELD | Admitting: Advanced Practice Midwife

## 2017-06-01 NOTE — Progress Notes (Signed)
error 

## 2017-06-02 ENCOUNTER — Encounter: Payer: BLUE CROSS/BLUE SHIELD | Admitting: Advanced Practice Midwife

## 2017-06-02 ENCOUNTER — Other Ambulatory Visit: Payer: BLUE CROSS/BLUE SHIELD

## 2017-07-26 DIAGNOSIS — Z6837 Body mass index (BMI) 37.0-37.9, adult: Secondary | ICD-10-CM | POA: Diagnosis not present

## 2017-07-26 DIAGNOSIS — Z23 Encounter for immunization: Secondary | ICD-10-CM | POA: Diagnosis not present

## 2017-07-26 DIAGNOSIS — K219 Gastro-esophageal reflux disease without esophagitis: Secondary | ICD-10-CM | POA: Diagnosis not present

## 2017-07-26 DIAGNOSIS — R109 Unspecified abdominal pain: Secondary | ICD-10-CM | POA: Diagnosis not present

## 2017-07-27 ENCOUNTER — Encounter: Payer: Self-pay | Admitting: Women's Health

## 2017-07-27 ENCOUNTER — Ambulatory Visit (INDEPENDENT_AMBULATORY_CARE_PROVIDER_SITE_OTHER): Payer: BLUE CROSS/BLUE SHIELD | Admitting: Women's Health

## 2017-07-27 VITALS — BP 108/58 | HR 64 | Ht 61.0 in | Wt 195.0 lb

## 2017-07-27 DIAGNOSIS — Z3202 Encounter for pregnancy test, result negative: Secondary | ICD-10-CM | POA: Diagnosis not present

## 2017-07-27 DIAGNOSIS — Z30017 Encounter for initial prescription of implantable subdermal contraceptive: Secondary | ICD-10-CM | POA: Insufficient documentation

## 2017-07-27 LAB — POCT URINE PREGNANCY: PREG TEST UR: NEGATIVE

## 2017-07-27 MED ORDER — ETONOGESTREL 68 MG ~~LOC~~ IMPL
68.0000 mg | DRUG_IMPLANT | Freq: Once | SUBCUTANEOUS | Status: AC
  Administered 2017-07-27: 68 mg via SUBCUTANEOUS

## 2017-07-27 NOTE — Progress Notes (Signed)
Kelly Mahoney is a 26 y.o. year old Hispanic female here for Nexplanon insertion.  Patient's last menstrual period was 07/09/2017., last sexual intercourse was 19days ago, and her pregnancy test today was negative.  Risks/benefits/side effects of Nexplanon have been discussed and her questions have been answered.  Specifically, a failure rate of 10/998 has been reported, with an increased failure rate if pt takes St. John's Wort and/or antiseizure medicaitons.  [name] is aware of the common side effect of irregular bleeding, which the incidence of decreases over time.  BP (!) 108/58   Pulse 64   Ht  (1.549 m)   Wt 195 lb (88.5 kg)   LMP 07/09/2017   BMI 36.84 kg/m   Results for orders placed or performed in visit on 07/27/17 (from the past 24 hour(s))  POCT urine pregnancy   Collection Time: 07/27/17 10:27 AM  Result Value Ref Range   Preg Test, Ur Negative Negative     She is right-handed, so her left arm, approximately 4 inches proximal from the elbow, was cleansed with alcohol and anesthetized with 2cc of 2% Lidocaine.  The area was cleansed again with betadine and the Nexplanon was inserted per manufacturer's recommendations without difficulty.  3 steri-strips and pressure bandage were applied.  Pt was instructed to keep the area clean and dry, remove pressure bandage in 24 hours, and keep insertion site covered with the steri-strip for 3-5 days.  Back up contraception was recommended for 2 weeks.  She was given a card indicating date Nexplanon was inserted and date it needs to be removed. Follow-up PRN problems. She has an appt w/ her PCP for physical in Nov, to call us for gyn exam if not performed w/ her PCP physical.   Marge Duncans CNM, [facility] 07/27/2017 10:35 AM

## 2017-07-27 NOTE — Patient Instructions (Signed)

## 2017-07-29 ENCOUNTER — Telehealth: Payer: Self-pay | Admitting: Women's Health

## 2017-07-29 NOTE — Telephone Encounter (Signed)
Returned pt's call, had Nexplanon placed 10/3, states 2 steri-strips have come off, only has 1 left, site looks normal. Can put bandaid over site, remove it and the steri-strip anywhere from Sat to Monday.  Cheral Marker, CNM, WHNP-BC 07/29/2017 1:55 PM

## 2017-09-02 ENCOUNTER — Other Ambulatory Visit: Payer: Self-pay | Admitting: Family Medicine

## 2017-09-02 DIAGNOSIS — R1011 Right upper quadrant pain: Secondary | ICD-10-CM

## 2017-09-03 ENCOUNTER — Encounter (HOSPITAL_COMMUNITY): Payer: Self-pay | Admitting: *Deleted

## 2017-09-03 ENCOUNTER — Other Ambulatory Visit: Payer: Self-pay

## 2017-09-03 ENCOUNTER — Inpatient Hospital Stay (HOSPITAL_COMMUNITY)
Admission: EM | Admit: 2017-09-03 | Discharge: 2017-09-06 | DRG: 419 | Disposition: A | Discharge status: Home / Self Care | Payer: 59 | Attending: General Surgery | Admitting: General Surgery

## 2017-09-03 ENCOUNTER — Emergency Department (HOSPITAL_COMMUNITY): Payer: 59

## 2017-09-03 DIAGNOSIS — R945 Abnormal results of liver function studies: Secondary | ICD-10-CM

## 2017-09-03 DIAGNOSIS — R1011 Right upper quadrant pain: Secondary | ICD-10-CM | POA: Diagnosis not present

## 2017-09-03 DIAGNOSIS — K8070 Calculus of gallbladder and bile duct without cholecystitis without obstruction: Secondary | ICD-10-CM | POA: Diagnosis not present

## 2017-09-03 DIAGNOSIS — K8010 Calculus of gallbladder with chronic cholecystitis without obstruction: Secondary | ICD-10-CM | POA: Diagnosis not present

## 2017-09-03 DIAGNOSIS — Z793 Long term (current) use of hormonal contraceptives: Secondary | ICD-10-CM | POA: Diagnosis not present

## 2017-09-03 DIAGNOSIS — K8063 Calculus of gallbladder and bile duct with acute cholecystitis with obstruction: Principal | ICD-10-CM | POA: Diagnosis present

## 2017-09-03 DIAGNOSIS — Z9889 Other specified postprocedural states: Secondary | ICD-10-CM | POA: Diagnosis not present

## 2017-09-03 DIAGNOSIS — Z79899 Other long term (current) drug therapy: Secondary | ICD-10-CM

## 2017-09-03 DIAGNOSIS — K8051 Calculus of bile duct without cholangitis or cholecystitis with obstruction: Secondary | ICD-10-CM | POA: Diagnosis not present

## 2017-09-03 DIAGNOSIS — E669 Obesity, unspecified: Secondary | ICD-10-CM | POA: Diagnosis present

## 2017-09-03 DIAGNOSIS — Z6836 Body mass index (BMI) 36.0-36.9, adult: Secondary | ICD-10-CM

## 2017-09-03 DIAGNOSIS — K8020 Calculus of gallbladder without cholecystitis without obstruction: Secondary | ICD-10-CM | POA: Diagnosis not present

## 2017-09-03 DIAGNOSIS — K219 Gastro-esophageal reflux disease without esophagitis: Secondary | ICD-10-CM | POA: Diagnosis not present

## 2017-09-03 DIAGNOSIS — K8050 Calculus of bile duct without cholangitis or cholecystitis without obstruction: Secondary | ICD-10-CM | POA: Diagnosis not present

## 2017-09-03 DIAGNOSIS — K831 Obstruction of bile duct: Secondary | ICD-10-CM | POA: Diagnosis not present

## 2017-09-03 DIAGNOSIS — K810 Acute cholecystitis: Secondary | ICD-10-CM

## 2017-09-03 DIAGNOSIS — R112 Nausea with vomiting, unspecified: Secondary | ICD-10-CM | POA: Diagnosis present

## 2017-09-03 DIAGNOSIS — R7989 Other specified abnormal findings of blood chemistry: Secondary | ICD-10-CM

## 2017-09-03 DIAGNOSIS — R17 Unspecified jaundice: Secondary | ICD-10-CM | POA: Diagnosis not present

## 2017-09-03 DIAGNOSIS — K824 Cholesterolosis of gallbladder: Secondary | ICD-10-CM | POA: Diagnosis not present

## 2017-09-03 DIAGNOSIS — Z8349 Family history of other endocrine, nutritional and metabolic diseases: Secondary | ICD-10-CM

## 2017-09-03 DIAGNOSIS — K8000 Calculus of gallbladder with acute cholecystitis without obstruction: Secondary | ICD-10-CM | POA: Diagnosis not present

## 2017-09-03 DIAGNOSIS — Z7952 Long term (current) use of systemic steroids: Secondary | ICD-10-CM | POA: Diagnosis not present

## 2017-09-03 DIAGNOSIS — R739 Hyperglycemia, unspecified: Secondary | ICD-10-CM | POA: Diagnosis not present

## 2017-09-03 DIAGNOSIS — K760 Fatty (change of) liver, not elsewhere classified: Secondary | ICD-10-CM | POA: Diagnosis present

## 2017-09-03 HISTORY — DX: Gastro-esophageal reflux disease without esophagitis: K21.9

## 2017-09-03 LAB — URINALYSIS, ROUTINE W REFLEX MICROSCOPIC
BILIRUBIN URINE: NEGATIVE
Glucose, UA: NEGATIVE mg/dL
Hgb urine dipstick: NEGATIVE
KETONES UR: NEGATIVE mg/dL
NITRITE: NEGATIVE
PH: 7 (ref 5.0–8.0)
Protein, ur: NEGATIVE mg/dL
Specific Gravity, Urine: 1.011 (ref 1.005–1.030)

## 2017-09-03 LAB — COMPREHENSIVE METABOLIC PANEL
ALBUMIN: 4.4 g/dL (ref 3.5–5.0)
ALT: 1638 U/L — ABNORMAL HIGH (ref 14–54)
ANION GAP: 8 (ref 5–15)
AST: 614 U/L — AB (ref 15–41)
Alkaline Phosphatase: 235 U/L — ABNORMAL HIGH (ref 38–126)
BILIRUBIN TOTAL: 6 mg/dL — AB (ref 0.3–1.2)
BUN: 11 mg/dL (ref 6–20)
CO2: 25 mmol/L (ref 22–32)
Calcium: 9.6 mg/dL (ref 8.9–10.3)
Chloride: 106 mmol/L (ref 101–111)
Creatinine, Ser: 0.45 mg/dL (ref 0.44–1.00)
GFR calc Af Amer: >60 mL/min (ref >60)
GFR calc non Af Amer: >60 mL/min (ref >60)
GLUCOSE: 148 mg/dL — AB (ref 65–99)
POTASSIUM: 4.2 mmol/L (ref 3.5–5.1)
SODIUM: 139 mmol/L (ref 135–145)
TOTAL PROTEIN: 8.3 g/dL — AB (ref 6.5–8.1)

## 2017-09-03 LAB — CBC WITH DIFFERENTIAL/PLATELET
BASOS ABS: 0 10*3/uL (ref 0.0–0.1)
BASOS PCT: 0 %
EOS PCT: 0 %
Eosinophils Absolute: 0 10*3/uL (ref 0.0–0.7)
HEMATOCRIT: 41.8 % (ref 36.0–46.0)
Hemoglobin: 14.4 g/dL (ref 12.0–15.0)
LYMPHS PCT: 10 %
Lymphs Abs: 0.9 10*3/uL (ref 0.7–4.0)
MCH: 30.4 pg (ref 26.0–34.0)
MCHC: 34.4 g/dL (ref 30.0–36.0)
MCV: 88.2 fL (ref 78.0–100.0)
Monocytes Absolute: 0.3 10*3/uL (ref 0.1–1.0)
Monocytes Relative: 3 %
NEUTROS ABS: 8.1 10*3/uL — AB (ref 1.7–7.7)
Neutrophils Relative %: 87 %
PLATELETS: 315 10*3/uL (ref 150–400)
RBC: 4.74 MIL/uL (ref 3.87–5.11)
RDW: 12.9 % (ref 11.5–15.5)
WBC: 9.2 10*3/uL (ref 4.0–10.5)

## 2017-09-03 LAB — LIPASE, BLOOD: Lipase: 45 U/L (ref 11–51)

## 2017-09-03 LAB — PREGNANCY, URINE: Preg Test, Ur: NEGATIVE

## 2017-09-03 MED ORDER — ONDANSETRON HCL 4 MG/2ML IJ SOLN
4.0000 mg | INTRAMUSCULAR | Status: AC | PRN
  Administered 2017-09-04 – 2017-09-06 (×2): 4 mg via INTRAVENOUS
  Filled 2017-09-03 (×3): qty 2

## 2017-09-03 MED ORDER — IOPAMIDOL (ISOVUE-300) INJECTION 61%
100.0000 mL | Freq: Once | INTRAVENOUS | Status: AC | PRN
  Administered 2017-09-03: 100 mL via INTRAVENOUS

## 2017-09-03 MED ORDER — MORPHINE SULFATE (PF) 4 MG/ML IV SOLN
4.0000 mg | INTRAVENOUS | Status: AC | PRN
  Administered 2017-09-04 – 2017-09-05 (×2): 4 mg via INTRAVENOUS
  Filled 2017-09-03 (×2): qty 1

## 2017-09-03 NOTE — ED Triage Notes (Signed)
Pt states she has right upper abdominal pain that started yesterday that radiates to the back and flank area.

## 2017-09-03 NOTE — ED Notes (Signed)
Patient transported to CT 

## 2017-09-03 NOTE — ED Provider Notes (Signed)
[facility] EMERGENCY DEPARTMENT Provider Note   CSN: [id number] Arrival date & time: 09/03/17  1944     History   Chief Complaint Chief Complaint  Patient presents with  . Abdominal Pain    HPI Kelly Mahoney is a 26 y.o. female.  HPI  Pt was seen at 2130.  Per pt, c/o gradual onset and worsening of constant RUQ abd "pain" for the past 2 to 3 days. Has been associated with multiple intermittent episodes of N/V and "soft stools."  Describes the abd pain as "cramping," "stabbing," with radiation into her back.  Denies diarrhea, no fevers, no back pain, no rash, no CP/SOB, no black or blood in stools or emesis.      Past Medical History:  Diagnosis Date  . Medical history non-contributory     Patient Active Problem List   Diagnosis Date Noted  . Nexplanon insertion 07/27/2017    Past Surgical History:  Procedure Laterality Date  . NO PAST SURGERIES      OB History    Gravida Para Term Preterm AB Living   1 1 1     1    SAB TAB Ectopic Multiple Live Births         0 1       Home Medications    Prior to Admission medications   Medication Sig Start Date End Date Taking? Authorizing Provider  albuterol (PROVENTIL HFA;VENTOLIN HFA) 108 (90 Base) MCG/ACT inhaler Inhale 1 puff into the lungs every 6 (six) hours as needed for wheezing or shortness of breath.   Yes [provider]  etonogestrel (NEXPLANON) 68 MG IMPL implant 1 each once by Subdermal route.   Yes [provider]  hydrOXYzine (ATARAX/VISTARIL) 25 MG tablet Take 25 mg every 8 (eight) hours as needed by mouth for itching.   Yes [provider]  omeprazole (PRILOSEC) 20 MG capsule Take 20 mg daily by mouth. 08/29/17  Yes [provider]  ondansetron (ZOFRAN-ODT) 4 MG disintegrating tablet Take 4 mg every 8 (eight) hours as needed by mouth for nausea or vomiting.   Yes [provider]  predniSONE (DELTASONE) 10 MG tablet Take 10-50 mg See admin instructions by mouth.  Starting on 09-02-2017 take five tablets on day 1, then four on day 2, then three on day 3, then two on day 4, then one on day 5, then stop   Yes [provider]  ranitidine (ZANTAC) 300 MG tablet Take 300 mg at bedtime by mouth.   Yes [provider]  ibuprofen (ADVIL,MOTRIN) 600 MG tablet Take 1 tablet (600 mg total) by mouth every 6 (six) hours. Patient not taking: Reported on 05/13/2017 03/31/17   Allie Bossier, MD    Family History History reviewed. No pertinent family history.  Social History Social History   Tobacco Use  . Smoking status: Never Smoker  . Smokeless tobacco: Never Used  Substance Use Topics  . Alcohol use: No  . Drug use: No     Allergies   Patient has no known allergies.   Review of Systems Review of Systems ROS: Statement: All systems negative except as marked or noted in the HPI; Constitutional: Negative for fever and chills. ; ; Eyes: Negative for eye pain, redness and discharge. ; ; ENMT: Negative for ear pain, hoarseness, nasal congestion, sinus pressure and sore throat. ; ; Cardiovascular: Negative for chest pain, palpitations, diaphoresis, dyspnea and peripheral edema. ; ; Respiratory: Negative for cough, wheezing and stridor. ; ; Gastrointestinal: +  N/V, abd pain. Negative for diarrhea, blood in stool, hematemesis, jaundice and rectal bleeding. . ; ; Genitourinary: Negative for dysuria, flank pain and hematuria. ; ; Musculoskeletal: Negative for back pain and neck pain. Negative for swelling and trauma.; ; Skin: Negative for pruritus, rash, abrasions, blisters, bruising and skin lesion.; ; Neuro: Negative for headache, lightheadedness and neck stiffness. Negative for weakness, altered level of consciousness, altered mental status, extremity weakness, paresthesias, involuntary movement, seizure and syncope.       Physical Exam Updated Vital Signs Pulse 89   Temp 98.3 F (36.8 C) (Oral)   Resp 17   Ht 5\' 1"  (1.549 m)   Wt 88.5 kg (195  lb)   LMP 08/13/2017   SpO2 98%   BMI 36.84 kg/m   Physical Exam 2135: Physical examination:  Nursing notes reviewed; Vital signs and O2 SAT reviewed;  Constitutional: Well developed, Well nourished, Well hydrated, Uncomfortable appearing.; Head:  Normocephalic, atraumatic; Eyes: EOMI, PERRL, No scleral icterus; ENMT: Mouth and pharynx normal, Mucous membranes moist; Neck: Supple, Full range of motion, No lymphadenopathy; Cardiovascular: Regular rate and rhythm, No gallop; Respiratory: Breath sounds clear & equal bilaterally, No wheezes.  Speaking full sentences with ease, Normal respiratory effort/excursion; Chest: Nontender, Movement normal; Abdomen: Soft, +RUQ and mid-epigastric areas tender to palp. No rebound or guarding. Nondistended, Normal bowel sounds; Genitourinary: No CVA tenderness; Extremities: Pulses normal, No tenderness, No edema, No calf edema or asymmetry.; Neuro: AA&Ox3, Major CN grossly intact.  Speech clear. No gross focal motor or sensory deficits in extremities.; Skin: Color normal, Warm, Dry.   ED Treatments / Results  Labs (all labs ordered are listed, but only abnormal results are displayed)   EKG  EKG Interpretation None       Radiology   Procedures Procedures (including critical care time)  Medications Ordered in ED Medications  morphine 4 MG/ML injection 4 mg (not administered)  ondansetron (ZOFRAN) injection 4 mg (not administered)  iopamidol (ISOVUE-300) 61 % injection 100 mL (not administered)     Initial Impression / Assessment and Plan / ED Course  I have reviewed the triage vital signs and the nursing notes.  Pertinent labs & imaging results that were available during my care of the patient were reviewed by me and considered in my medical decision making (see chart for details).  MDM Reviewed: previous chart, nursing note and vitals Reviewed previous: labs Interpretation: labs and x-ray   Results for orders placed or performed during  the hospital encounter of 09/03/17  Urinalysis, Routine w reflex microscopic  Result Value Ref Range   Color, Urine AMBER (A) YELLOW   APPearance CLEAR CLEAR   Specific Gravity, Urine 1.011 1.005 - 1.030   pH 7.0 5.0 - 8.0   Glucose, UA NEGATIVE NEGATIVE mg/dL   Hgb urine dipstick NEGATIVE NEGATIVE   Bilirubin Urine NEGATIVE NEGATIVE   Ketones, ur NEGATIVE NEGATIVE mg/dL   Protein, ur NEGATIVE NEGATIVE mg/dL   Nitrite NEGATIVE NEGATIVE   Leukocytes, UA TRACE (A) NEGATIVE   RBC / HPF 0-5 0 - 5 RBC/hpf   WBC, UA 0-5 0 - 5 WBC/hpf   Bacteria, UA RARE (A) NONE SEEN   Squamous Epithelial / LPF 0-5 (A) NONE SEEN  Pregnancy, urine  Result Value Ref Range   Preg Test, Ur NEGATIVE NEGATIVE  CBC with Differential  Result Value Ref Range   WBC 9.2 4.0 - 10.5 K/uL   RBC 4.74 3.87 - 5.11 MIL/uL   Hemoglobin 14.4 12.0 - 15.0 g/dL  HCT 41.8 36.0 - 46.0 %   MCV 88.2 78.0 - 100.0 fL   MCH 30.4 26.0 - 34.0 pg   MCHC 34.4 30.0 - 36.0 g/dL   RDW 78.212.9 95.611.5 - 21.315.5 %   Platelets 315 150 - 400 K/uL   Neutrophils Relative % 87 %   Neutro Abs 8.1 (H) 1.7 - 7.7 K/uL   Lymphocytes Relative 10 %   Lymphs Abs 0.9 0.7 - 4.0 K/uL   Monocytes Relative 3 %   Monocytes Absolute 0.3 0.1 - 1.0 K/uL   Eosinophils Relative 0 %   Eosinophils Absolute 0.0 0.0 - 0.7 K/uL   Basophils Relative 0 %   Basophils Absolute 0.0 0.0 - 0.1 K/uL  Lipase, blood  Result Value Ref Range   Lipase 45 11 - 51 U/L  Comprehensive metabolic panel  Result Value Ref Range   Sodium 139 135 - 145 mmol/L   Potassium 4.2 3.5 - 5.1 mmol/L   Chloride 106 101 - 111 mmol/L   CO2 25 22 - 32 mmol/L   Glucose, Bld 148 (H) 65 - 99 mg/dL   BUN 11 6 - 20 mg/dL   Creatinine, Ser 0.860.45 0.44 - 1.00 mg/dL   Calcium 9.6 8.9 - 57.810.3 mg/dL   Total Protein 8.3 (H) 6.5 - 8.1 g/dL   Albumin 4.4 3.5 - 5.0 g/dL   AST 469614 (H) 15 - 41 U/L   ALT 1,638 (H) 14 - 54 U/L   Alkaline Phosphatase 235 (H) 38 - 126 U/L   Total Bilirubin 6.0 (H) 0.3 - 1.2  mg/dL   GFR calc non Af Amer >60 >60 mL/min   GFR calc Af Amer >60 >60 mL/min   Anion gap 8 5 - 15    Dg Chest 2 View Result Date: 09/03/2017 CLINICAL DATA:  26 y/o  F; abdominal pain, nausea, vomiting. EXAM: CHEST  2 VIEW COMPARISON:  None. FINDINGS: The heart size and mediastinal contours are within normal limits. Both lungs are clear. The visualized skeletal structures are unremarkable. IMPRESSION: No active cardiopulmonary disease. Electronically Signed   By: Mitzi HansenLance  Furusawa-Stratton M.D.   On: 09/03/2017 23:44    0010:  LFT's elevated. CT A/P pending. Sign out to Dr. Lynelle DoctorKnapp.    Final Clinical Impressions(s) / ED Diagnoses   Final diagnoses:  None    ED Discharge Orders    None       Samuel JesterMcManus, Cloe Sockwell, DO 09/04/17 0013

## 2017-09-04 ENCOUNTER — Encounter (HOSPITAL_COMMUNITY): Payer: Self-pay | Admitting: Internal Medicine

## 2017-09-04 ENCOUNTER — Inpatient Hospital Stay (HOSPITAL_COMMUNITY): Payer: 59 | Admitting: Anesthesiology

## 2017-09-04 ENCOUNTER — Encounter (HOSPITAL_COMMUNITY)
Admission: EM | Disposition: A | Discharge status: Home / Self Care | Payer: Self-pay | Source: Home / Self Care | Attending: Internal Medicine

## 2017-09-04 ENCOUNTER — Inpatient Hospital Stay (HOSPITAL_COMMUNITY): Payer: 59

## 2017-09-04 DIAGNOSIS — Z79899 Other long term (current) drug therapy: Secondary | ICD-10-CM | POA: Diagnosis not present

## 2017-09-04 DIAGNOSIS — K8063 Calculus of gallbladder and bile duct with acute cholecystitis with obstruction: Secondary | ICD-10-CM | POA: Diagnosis present

## 2017-09-04 DIAGNOSIS — K8050 Calculus of bile duct without cholangitis or cholecystitis without obstruction: Secondary | ICD-10-CM

## 2017-09-04 DIAGNOSIS — R945 Abnormal results of liver function studies: Secondary | ICD-10-CM

## 2017-09-04 DIAGNOSIS — Z793 Long term (current) use of hormonal contraceptives: Secondary | ICD-10-CM | POA: Diagnosis not present

## 2017-09-04 DIAGNOSIS — R17 Unspecified jaundice: Secondary | ICD-10-CM | POA: Diagnosis not present

## 2017-09-04 DIAGNOSIS — R739 Hyperglycemia, unspecified: Secondary | ICD-10-CM | POA: Diagnosis present

## 2017-09-04 DIAGNOSIS — R7989 Other specified abnormal findings of blood chemistry: Secondary | ICD-10-CM | POA: Diagnosis not present

## 2017-09-04 DIAGNOSIS — K8070 Calculus of gallbladder and bile duct without cholecystitis without obstruction: Secondary | ICD-10-CM | POA: Diagnosis not present

## 2017-09-04 DIAGNOSIS — K8010 Calculus of gallbladder with chronic cholecystitis without obstruction: Secondary | ICD-10-CM | POA: Diagnosis not present

## 2017-09-04 DIAGNOSIS — R112 Nausea with vomiting, unspecified: Secondary | ICD-10-CM | POA: Diagnosis present

## 2017-09-04 DIAGNOSIS — Z8349 Family history of other endocrine, nutritional and metabolic diseases: Secondary | ICD-10-CM | POA: Diagnosis not present

## 2017-09-04 DIAGNOSIS — Z6836 Body mass index (BMI) 36.0-36.9, adult: Secondary | ICD-10-CM | POA: Diagnosis not present

## 2017-09-04 DIAGNOSIS — Z7952 Long term (current) use of systemic steroids: Secondary | ICD-10-CM | POA: Diagnosis not present

## 2017-09-04 DIAGNOSIS — K8000 Calculus of gallbladder with acute cholecystitis without obstruction: Secondary | ICD-10-CM | POA: Diagnosis not present

## 2017-09-04 DIAGNOSIS — R1011 Right upper quadrant pain: Secondary | ICD-10-CM | POA: Diagnosis not present

## 2017-09-04 DIAGNOSIS — K219 Gastro-esophageal reflux disease without esophagitis: Secondary | ICD-10-CM | POA: Diagnosis present

## 2017-09-04 DIAGNOSIS — E669 Obesity, unspecified: Secondary | ICD-10-CM | POA: Diagnosis present

## 2017-09-04 DIAGNOSIS — K824 Cholesterolosis of gallbladder: Secondary | ICD-10-CM | POA: Diagnosis not present

## 2017-09-04 DIAGNOSIS — Z9889 Other specified postprocedural states: Secondary | ICD-10-CM | POA: Diagnosis not present

## 2017-09-04 DIAGNOSIS — K8051 Calculus of bile duct without cholangitis or cholecystitis with obstruction: Secondary | ICD-10-CM | POA: Diagnosis not present

## 2017-09-04 DIAGNOSIS — K831 Obstruction of bile duct: Secondary | ICD-10-CM | POA: Diagnosis not present

## 2017-09-04 DIAGNOSIS — K760 Fatty (change of) liver, not elsewhere classified: Secondary | ICD-10-CM | POA: Diagnosis present

## 2017-09-04 DIAGNOSIS — K8020 Calculus of gallbladder without cholecystitis without obstruction: Secondary | ICD-10-CM | POA: Diagnosis not present

## 2017-09-04 HISTORY — PX: SPHINCTEROTOMY: SHX5279

## 2017-09-04 HISTORY — PX: ERCP: SHX5425

## 2017-09-04 LAB — COMPREHENSIVE METABOLIC PANEL
ALK PHOS: 234 U/L — AB (ref 38–126)
ALT: 1508 U/L — AB (ref 14–54)
AST: 464 U/L — ABNORMAL HIGH (ref 15–41)
Albumin: 4.3 g/dL (ref 3.5–5.0)
Anion gap: 9 (ref 5–15)
BILIRUBIN TOTAL: 3.9 mg/dL — AB (ref 0.3–1.2)
BUN: 10 mg/dL (ref 6–20)
CALCIUM: 9.4 mg/dL (ref 8.9–10.3)
CO2: 26 mmol/L (ref 22–32)
CREATININE: 0.57 mg/dL (ref 0.44–1.00)
Chloride: 103 mmol/L (ref 101–111)
Glucose, Bld: 113 mg/dL — ABNORMAL HIGH (ref 65–99)
Potassium: 3.4 mmol/L — ABNORMAL LOW (ref 3.5–5.1)
Sodium: 138 mmol/L (ref 135–145)
TOTAL PROTEIN: 8.2 g/dL — AB (ref 6.5–8.1)

## 2017-09-04 LAB — PROTIME-INR
INR: 1.09
PROTHROMBIN TIME: 14 s (ref 11.4–15.2)

## 2017-09-04 LAB — BILIRUBIN, DIRECT: BILIRUBIN DIRECT: 2.3 mg/dL — AB (ref 0.1–0.5)

## 2017-09-04 LAB — APTT: APTT: 35 s (ref 24–36)

## 2017-09-04 LAB — SURGICAL PCR SCREEN
MRSA, PCR: NEGATIVE
STAPHYLOCOCCUS AUREUS: NEGATIVE

## 2017-09-04 LAB — AMMONIA: Ammonia: 53 umol/L — ABNORMAL HIGH (ref 9–35)

## 2017-09-04 SURGERY — ERCP, WITH INTERVENTION IF INDICATED
Anesthesia: General

## 2017-09-04 MED ORDER — SEVOFLURANE IN SOLN
RESPIRATORY_TRACT | Status: AC
  Filled 2017-09-04: qty 250

## 2017-09-04 MED ORDER — PROPOFOL 10 MG/ML IV BOLUS
INTRAVENOUS | Status: DC | PRN
  Administered 2017-09-04: 150 mg via INTRAVENOUS

## 2017-09-04 MED ORDER — FENTANYL CITRATE (PF) 100 MCG/2ML IJ SOLN
INTRAMUSCULAR | Status: AC
  Filled 2017-09-04: qty 2

## 2017-09-04 MED ORDER — DEXTROSE 5 % IV SOLN
2.0000 g | Freq: Once | INTRAVENOUS | Status: AC
  Administered 2017-09-04: 2 g via INTRAVENOUS
  Filled 2017-09-04: qty 2

## 2017-09-04 MED ORDER — IOPAMIDOL (ISOVUE-300) INJECTION 61%
INTRAVENOUS | Status: AC
  Administered 2017-09-04: 12:00:00
  Filled 2017-09-04: qty 100

## 2017-09-04 MED ORDER — MIDAZOLAM HCL 5 MG/5ML IJ SOLN
INTRAMUSCULAR | Status: DC | PRN
  Administered 2017-09-04: 2 mg via INTRAVENOUS

## 2017-09-04 MED ORDER — DEXAMETHASONE SODIUM PHOSPHATE 4 MG/ML IJ SOLN
INTRAMUSCULAR | Status: DC | PRN
  Administered 2017-09-04: 4 mg via INTRAVENOUS

## 2017-09-04 MED ORDER — ARTIFICIAL TEARS OPHTHALMIC OINT
TOPICAL_OINTMENT | OPHTHALMIC | Status: AC
Start: 2017-09-04 — End: 2017-09-04
  Filled 2017-09-04: qty 3.5

## 2017-09-04 MED ORDER — DEXTROSE 5 % IV SOLN
2.0000 g | INTRAVENOUS | Status: DC
  Administered 2017-09-04: 2 g via INTRAVENOUS
  Filled 2017-09-04 (×4): qty 2

## 2017-09-04 MED ORDER — EPHEDRINE SULFATE 50 MG/ML IJ SOLN
INTRAMUSCULAR | Status: AC
Start: 2017-09-04 — End: 2017-09-04
  Filled 2017-09-04: qty 1

## 2017-09-04 MED ORDER — SUCCINYLCHOLINE CHLORIDE 20 MG/ML IJ SOLN
INTRAMUSCULAR | Status: AC
  Filled 2017-09-04: qty 1

## 2017-09-04 MED ORDER — PROPOFOL 10 MG/ML IV BOLUS
INTRAVENOUS | Status: AC
  Filled 2017-09-04: qty 40

## 2017-09-04 MED ORDER — SODIUM CHLORIDE 0.9 % IV SOLN: INTRAVENOUS | Status: DC

## 2017-09-04 MED ORDER — SUCCINYLCHOLINE CHLORIDE 20 MG/ML IJ SOLN
INTRAMUSCULAR | Status: AC
  Filled 2017-09-04: qty 2

## 2017-09-04 MED ORDER — CHLORHEXIDINE GLUCONATE CLOTH 2 % EX PADS
6.0000 | MEDICATED_PAD | Freq: Once | CUTANEOUS | Status: AC
  Administered 2017-09-04: 6 via TOPICAL

## 2017-09-04 MED ORDER — FAMOTIDINE 20 MG PO TABS
20.0000 mg | ORAL_TABLET | Freq: Two times a day (BID) | ORAL | Status: DC
  Administered 2017-09-04 – 2017-09-06 (×5): 20 mg via ORAL
  Filled 2017-09-04 (×5): qty 1

## 2017-09-04 MED ORDER — SODIUM CHLORIDE 0.9 % IV SOLN
INTRAVENOUS | Status: DC
  Administered 2017-09-04 – 2017-09-05 (×3): via INTRAVENOUS

## 2017-09-04 MED ORDER — CHLORHEXIDINE GLUCONATE CLOTH 2 % EX PADS
6.0000 | MEDICATED_PAD | Freq: Once | CUTANEOUS | Status: AC
  Administered 2017-09-05: 6 via TOPICAL

## 2017-09-04 MED ORDER — SODIUM CHLORIDE 0.9 % IJ SOLN
INTRAMUSCULAR | Status: AC
  Filled 2017-09-04: qty 10

## 2017-09-04 MED ORDER — IOPAMIDOL (ISOVUE-M 300) INJECTION 61%
INTRAMUSCULAR | Status: DC | PRN
  Administered 2017-09-04: 30 mL

## 2017-09-04 MED ORDER — ETONOGESTREL 68 MG ~~LOC~~ IMPL: 1.0000 | DRUG_IMPLANT | Freq: Once | SUBCUTANEOUS | Status: DC

## 2017-09-04 MED ORDER — MIDAZOLAM HCL 2 MG/2ML IJ SOLN
INTRAMUSCULAR | Status: AC
  Filled 2017-09-04: qty 2

## 2017-09-04 MED ORDER — GLUCAGON HCL RDNA (DIAGNOSTIC) 1 MG IJ SOLR
INTRAMUSCULAR | Status: AC
  Filled 2017-09-04: qty 1

## 2017-09-04 MED ORDER — STERILE WATER FOR IRRIGATION IR SOLN
Status: DC | PRN
  Administered 2017-09-04: 100 mL

## 2017-09-04 MED ORDER — HYDROXYZINE HCL 25 MG PO TABS
25.0000 mg | ORAL_TABLET | Freq: Three times a day (TID) | ORAL | Status: DC | PRN
  Administered 2017-09-04: 25 mg via ORAL
  Filled 2017-09-04: qty 1

## 2017-09-04 MED ORDER — SCOPOLAMINE 1 MG/3DAYS TD PT72
MEDICATED_PATCH | TRANSDERMAL | Status: DC | PRN
  Administered 2017-09-04: 1 via TRANSDERMAL

## 2017-09-04 MED ORDER — GLUCAGON HCL RDNA (DIAGNOSTIC) 1 MG IJ SOLR
INTRAMUSCULAR | Status: DC | PRN
  Administered 2017-09-04 (×3): 0.25 mg via INTRAVENOUS

## 2017-09-04 MED ORDER — ONDANSETRON HCL 4 MG/2ML IJ SOLN
INTRAMUSCULAR | Status: DC | PRN
  Administered 2017-09-04: 4 mg via INTRAVENOUS

## 2017-09-04 MED ORDER — FENTANYL CITRATE (PF) 100 MCG/2ML IJ SOLN
INTRAMUSCULAR | Status: DC | PRN
  Administered 2017-09-04 (×3): 25 ug via INTRAVENOUS

## 2017-09-04 MED ORDER — SCOPOLAMINE 1 MG/3DAYS TD PT72
MEDICATED_PATCH | TRANSDERMAL | Status: AC
  Filled 2017-09-04: qty 1

## 2017-09-04 MED ORDER — ALBUTEROL SULFATE HFA 108 (90 BASE) MCG/ACT IN AERS
INHALATION_SPRAY | RESPIRATORY_TRACT | Status: AC
  Filled 2017-09-04: qty 6.7

## 2017-09-04 MED ORDER — GLYCOPYRROLATE 0.2 MG/ML IJ SOLN
INTRAMUSCULAR | Status: DC | PRN
  Administered 2017-09-04: 0.2 mg via INTRAVENOUS

## 2017-09-04 MED ORDER — PANTOPRAZOLE SODIUM 40 MG PO TBEC
40.0000 mg | DELAYED_RELEASE_TABLET | Freq: Every day | ORAL | Status: DC
  Administered 2017-09-04 – 2017-09-06 (×3): 40 mg via ORAL
  Filled 2017-09-04 (×3): qty 1

## 2017-09-04 MED ORDER — LACTATED RINGERS IV SOLN
INTRAVENOUS | Status: DC | PRN
  Administered 2017-09-04: 12:00:00 via INTRAVENOUS

## 2017-09-04 MED ORDER — SUCCINYLCHOLINE CHLORIDE 20 MG/ML IJ SOLN
INTRAMUSCULAR | Status: DC | PRN
  Administered 2017-09-04: 140 mg via INTRAVENOUS

## 2017-09-04 MED ORDER — HYDROMORPHONE HCL 1 MG/ML IJ SOLN
0.5000 mg | INTRAMUSCULAR | Status: DC | PRN
  Administered 2017-09-05 – 2017-09-06 (×4): 0.5 mg via INTRAVENOUS
  Filled 2017-09-04 (×5): qty 1

## 2017-09-04 NOTE — Anesthesia Preprocedure Evaluation (Signed)
Anesthesia Evaluation  Patient identified by MRN, date of birth, ID band  Airway Mallampati: I  TM Distance: >3 FB Neck ROM: Full    Dental no notable dental hx. (+) Teeth Intact   Pulmonary    Pulmonary exam normal breath sounds clear to auscultation       Cardiovascular Normal cardiovascular exam Rhythm:Regular Rate:Normal     Neuro/Psych    GI/Hepatic GERD  Medicated and Poorly Controlled,Some nausea this morning   Endo/Other    Renal/GU      Musculoskeletal   Abdominal Normal abdominal exam  (+)   Peds  Hematology   Anesthesia Other Findings   Reproductive/Obstetrics                             Anesthesia Physical Anesthesia Plan  ASA: I and emergent  Anesthesia Plan: General   Post-op Pain Management:    Induction: Intravenous, Rapid sequence and Cricoid pressure planned  PONV Risk Score and Plan: 3 and Ondansetron, Dexamethasone and Scopolamine patch - Pre-op  Airway Management Planned:   Additional Equipment:   Intra-op Plan:   Post-operative Plan: Extubation in OR  Informed Consent:   Dental advisory given  Plan Discussed with: Surgeon  Anesthesia Plan Comments:         Anesthesia Quick Evaluation

## 2017-09-04 NOTE — Assessment & Plan Note (Signed)
Recently diagnosed, but diagnosis is now in question given patient's LFTs. Plan: Will continue home meds for now.

## 2017-09-04 NOTE — Assessment & Plan Note (Signed)
T bili on admission is 6. No history of jaundice or elevated LFTs. Plan: CMP, direct bili in AM. Hepatitis panel. GI consult.

## 2017-09-04 NOTE — Assessment & Plan Note (Signed)
Likely cause of her RUQ pain and elevated LFTs. Plan: IV dilaudid prn. Cultures ordered. IV antibiotics empirically. GI consult. General surgery consult.

## 2017-09-04 NOTE — Op Note (Signed)
[facility] Patient Name: Kelly PiaMayra Mahoney Procedure Date: 09/04/2017 11:42 AM MRN: [id number] Date of Birth: September 15, 1991 Attending MD: Lionel DecemberNajeeb Jesenia Spera , MD CSN: [id number] Age: [age] Admit Type: Inpatient Procedure:                ERCP Indications:              For therapy of bile duct stone(s) Providers:                Lionel DecemberNajeeb Sophiea Ueda, MD, Jannett CelestineAnitra Bell, RN, Buel ReamAngela A.                            Thomasena Edisollins RN, RN, Judee ClaraPamela Shreve, RN Referring MD:             Zannie CovePreetha Joseph, MD Medicines:                General Anesthesia Complications:            No immediate complications. Estimated Blood Loss:     Estimated blood loss: none. Procedure:                Pre-Anesthesia Assessment:                           - Prior to the procedure, a History and Physical                            was performed, and patient medications and                            allergies were reviewed. The patient's tolerance of                            previous anesthesia was also reviewed. The risks                            and benefits of the procedure and the sedation                            options and risks were discussed with the patient.                            All questions were answered, and informed consent                            was obtained. Prior Anticoagulants: The patient has                            taken no previous anticoagulant or antiplatelet                            agents. ASA Grade Assessment: I - A normal, healthy                            patient. After reviewing the risks and benefits,  the patient was deemed in satisfactory condition to                            undergo the procedure.                           After obtaining informed consent, the scope was                            passed under direct vision. Throughout the                            procedure, the patient's blood pressure, pulse, and                            oxygen saturations  were monitored continuously. The                            [id number] ([id number] scope was introduced through                            the mouth, and used to inject contrast into and                            used to inject contrast into the bile duct and                            ventral pancreatic duct. The ERCP was accomplished                            without difficulty. The patient tolerated the                            procedure well. Scope In: 12:52:04 PM Scope Out: 1:14:56 PM Total Procedure Duration: 0 hours 22 minutes 52 seconds  Findings:      The scout film was normal. The esophagus was successfully intubated       under direct vision. The scope was advanced to a normal major papilla in       the descending duodenum without detailed examination of the pharynx,       larynx and associated structures, and upper GI tract. The upper GI tract       was grossly normal. The main pancreatic duct was deeply cannulated with       the short-nosed traction sphincterotome. Contrast was injected. I       personally interpreted the pancreatic duct images. Ductal flow of       contrast was adequate. The pancreatic duct in the head of the pancreas,       pancreatic duct in the genu of the pancreas and pancreatic duct in the       body of the pancreas were normal. The bile duct was deeply cannulated       with the Autotome sphincterotome and 035 hydrajag wire.. Contrast was       injected. The common bile duct and common hepatic duct were mildly       dilated  and diffusely dilated, with a stone causing an obstruction. The       largest diameter was 11mm. An 8 mm biliary sphincterotomy was made with       a braided Autotome sphincterotome using ERBE electrocautery. There was       no post-sphincterotomy bleeding. The biliary tree was swept with an 11       mm balloon and basket starting at the bifurcation. One stone was       removed. No stones remained. Impression:               - The  common bile duct and common hepatic duct were                            mildly dilated, with a stone causing an obstruction.                           - Choledocholithiasis was found. Complete removal                            was accomplished by biliary sphincterotomy and                            basket extraction.                           - A biliary sphincterotomy was performed. Stone                            could not be removed with stone balloon extractor                            but easily removed with dormia basket. Moderate Sedation:      Per Anesthesia Care Recommendation:           - Avoid aspirin and nonsteroidal anti-inflammatory                            medicines for 3 days.                           - Clear liquid diet today.                           - Return patient to hospital ward for ongoing care.                           - Continue present medications.                           - Can proceed with cholecystectomy in am. Procedure Code(s):        --- Professional ---                           [phone], Endoscopic retrograde                            cholangiopancreatography (ERCP);  with removal of                            calculi/debris from biliary/pancreatic duct(s)                           43262, Endoscopic retrograde                            cholangiopancreatography (ERCP); with                            sphincterotomy/papillotomy Diagnosis Code(s):        --- Professional ---                           K80.51, Calculus of bile duct without cholangitis                            or cholecystitis with obstruction CPT copyright 2016 [employer] Association. All rights reserved. The codes documented in this report are preliminary and upon coder review may  be revised to meet current compliance requirements. Lionel December, MD Lionel December, MD 09/04/2017 1:44:51 PM This report has been signed electronically. Number of Addenda: 0

## 2017-09-04 NOTE — Assessment & Plan Note (Signed)
PRN Zofran.

## 2017-09-04 NOTE — Assessment & Plan Note (Signed)
Severely elevated LFTs: on admission, AST 614, ALT 1638, T bili 6, AP 235. Suspect is due to acute choledocholithiasis. Plan: Recheck in AM. Check Direct bili, ammonia level, hepatitis panel. GI consult.

## 2017-09-04 NOTE — Anesthesia Postprocedure Evaluation (Signed)
Anesthesia Post Note  Patient: Kelly Mahoney late entry for 200pm  Procedure(s) Performed: ENDOSCOPIC RETROGRADE CHOLANGIOPANCREATOGRAPHY (ERCP) (N/A ) SPHINCTEROTOMY , STONE BASKET EXTRACTION (N/A )  Patient location during evaluation: PACU Anesthesia Type: General Level of consciousness: awake and alert, oriented and patient cooperative Pain management: pain level controlled Vital Signs Assessment: post-procedure vital signs reviewed and stable Respiratory status: spontaneous breathing Cardiovascular status: stable Postop Assessment: no apparent nausea or vomiting Anesthetic complications: no     Last Vitals:  Vitals:   09/04/17 1339 09/04/17 1345  BP: 131/86 130/86  Pulse:  87  Resp: 14 14  Temp: 37.2 C   SpO2: 100% 100%    Last Pain:  Vitals:   09/04/17 1345  TempSrc:   PainSc: 0-No pain                 ADAMS, AMY A

## 2017-09-04 NOTE — Transfer of Care (Signed)
Immediate Anesthesia Transfer of Care Note  Patient: Kelly Mahoney  Procedure(s) Performed: ENDOSCOPIC RETROGRADE CHOLANGIOPANCREATOGRAPHY (ERCP) (N/A ) SPHINCTEROTOMY , STONE BASKET EXTRACTION (N/A )  Patient Location: PACU  Anesthesia Type:General  Level of Consciousness: awake, alert , oriented and patient cooperative  Airway & Oxygen Therapy: Patient Spontanous Breathing and Patient connected to nasal cannula oxygen  Post-op Assessment: Report given to RN and Post -op Vital signs reviewed and stable  Post vital signs: Reviewed and stable  Last Vitals:  Vitals:   09/04/17 1226 09/04/17 1339  BP: 139/72 131/86  Pulse: 78   Resp: 16 14  Temp:    SpO2: 100%     Last Pain:  Vitals:   09/04/17 1211  TempSrc:   PainSc: 0-No pain      Patients Stated Pain Goal: 0 (09/04/17 0957)  Complications: No apparent anesthesia complications

## 2017-09-04 NOTE — Assessment & Plan Note (Signed)
Plan: IV dilaudid PRN. GI and general surgery consults.

## 2017-09-04 NOTE — Anesthesia Procedure Notes (Signed)
Procedure Name: Intubation Date/Time: 09/04/2017 12:37 PM Performed by: Andree Elk, Amy A, CRNA Pre-anesthesia Checklist: Patient identified, Timeout performed, Emergency Drugs available, Suction available and Patient being monitored Patient Re-evaluated:Patient Re-evaluated prior to induction Oxygen Delivery Method: Circle System Utilized Preoxygenation: Pre-oxygenation with 100% oxygen Induction Type: IV induction, Rapid sequence and Cricoid Pressure applied Laryngoscope Size: Mac and 3 Grade View: Grade I Tube type: Oral Tube size: 7.0 mm Number of attempts: 1 Airway Equipment and Method: Stylet Placement Confirmation: ETT inserted through vocal cords under direct vision,  positive ETCO2 and breath sounds checked- equal and bilateral Secured at: 21 cm Tube secured with: Tape Dental Injury: Teeth and Oropharynx as per pre-operative assessment

## 2017-09-04 NOTE — Consult Note (Signed)
[facility] Consult  Reason for Consult: Choledocholithiasis  Referring Physician:  Dr. Broadus John, Dr. Laural Golden   Chief Complaint    Abdominal Pain      Kelly Mahoney is a 26 y.o. female.  HPI: Kelly Mahoney is a very pleasant 26 yo female with likely choledocholithiasis and known cholelithiasis on [name]. She came into the ED with jaundice, complaints of RUQ abdominal pain and nausea/vomiting for 3 days. She as found to have an enlarged CBD on imaging, measuring 40m and CT and [place] but given the labs and other findings it is very likely. She is otherwise healthy, and only has been treated for GERD for the last month after having abdominal pain at night.  She has had 1 child vaginally and no other surgeries.  Dr. RLaural Goldenis taking her for ERCP today.  Currently her pain is controlled, and she has no nausea/vomiting.   Of note she had a pruritic rash a few days ago and was give a prednisone taper, she ports that she did have a rash and was not just itching which would go along with the hyperbilirubinemia.   Past Medical History:  Diagnosis Date  . GERD (gastroesophageal reflux disease)     Past Surgical History:  Procedure Laterality Date  . NO PAST SURGERIES      Family History  Problem Relation Age of Onset  . Hyperlipidemia Father   . Hyperlipidemia Mother   . Liver disease Mother        Fatty Liver, possibly elevated LFTs    Social History   Tobacco Use  . Smoking status: Never Smoker  . Smokeless tobacco: Never Used  Substance Use Topics  . Alcohol use: No  . Drug use: No    Medications:  I have reviewed the patient's current medications. Prior to Admission:  Medications Prior to Admission  Medication Sig Dispense Refill Last Dose  . albuterol (PROVENTIL HFA;VENTOLIN HFA) 108 (90 Base) MCG/ACT inhaler Inhale 1 puff into the lungs every 6 (six) hours as needed for wheezing or shortness of breath.   unknown  . etonogestrel (NEXPLANON)  68 MG IMPL implant 1 each once by Subdermal route.   current  . hydrOXYzine (ATARAX/VISTARIL) 25 MG tablet Take 25 mg every 8 (eight) hours as needed by mouth for itching.   09/03/2017 at Unknown time  . omeprazole (PRILOSEC) 20 MG capsule Take 20 mg daily by mouth.  4 09/03/2017 at Unknown time  . ondansetron (ZOFRAN-ODT) 4 MG disintegrating tablet Take 4 mg every 8 (eight) hours as needed by mouth for nausea or vomiting.   09/02/2017 at Unknown time  . predniSONE (DELTASONE) 10 MG tablet Take 10-50 mg See admin instructions by mouth. Starting on 09-02-2017 take five tablets on day 1, then four on day 2, then three on day 3, then two on day 4, then one on day 5, then stop   09/03/2017 at Unknown time  . ranitidine (ZANTAC) 300 MG tablet Take 300 mg at bedtime by mouth.   09/02/2017 at Unknown time  . ibuprofen (ADVIL,MOTRIN) 600 MG tablet Take 1 tablet (600 mg total) by mouth every 6 (six) hours. (Patient not taking: Reported on 05/13/2017) 30 tablet 0 Not Taking   Scheduled: . famotidine  20 mg Oral BID  . pantoprazole  40 mg Oral Daily   Continuous: . sodium chloride 100 mL/hr at 09/04/17 0644   Allergies: No Known Allergies   ROS:  A comprehensive review of systems was negative except  for: Ears, nose, mouth, throat, and face: positive for yellow eyes Gastrointestinal: positive for abdominal pain, nausea, vomiting and pale stools, dark urine  Blood pressure (!) 142/71, pulse 62, temperature 97.9 F (36.6 C), temperature source Oral, resp. rate 16, height '5\' 2"'  (1.575 m), weight 191 lb (86.6 kg), last menstrual period 08/13/2017, SpO2 100 %, currently breastfeeding. Physical Exam  Results: Results for orders placed or performed during the hospital encounter of 09/03/17 (from the past 48 hour(s))  Urinalysis, Routine w reflex microscopic     Status: Abnormal   Collection Time: 09/03/17  9:01 PM  Result Value Ref Range   Color, Urine AMBER (A) YELLOW    Comment: BIOCHEMICALS MAY BE  AFFECTED BY COLOR   APPearance CLEAR CLEAR   Specific Gravity, Urine 1.011 1.005 - 1.030   pH 7.0 5.0 - 8.0   Glucose, UA NEGATIVE NEGATIVE mg/dL   Hgb urine dipstick NEGATIVE NEGATIVE   Bilirubin Urine NEGATIVE NEGATIVE   Ketones, ur NEGATIVE NEGATIVE mg/dL   Protein, ur NEGATIVE NEGATIVE mg/dL   Nitrite NEGATIVE NEGATIVE   Leukocytes, UA TRACE (A) NEGATIVE   RBC / HPF 0-5 0 - 5 RBC/hpf   WBC, UA 0-5 0 - 5 WBC/hpf   Bacteria, UA RARE (A) NONE SEEN   Squamous Epithelial / LPF 0-5 (A) NONE SEEN  Pregnancy, urine     Status: None   Collection Time: 09/03/17  9:01 PM  Result Value Ref Range   Preg Test, Ur NEGATIVE NEGATIVE    Comment:        THE SENSITIVITY OF THIS METHODOLOGY IS >20 mIU/mL.   CBC with Differential     Status: Abnormal   Collection Time: 09/03/17 10:36 PM  Result Value Ref Range   WBC 9.2 4.0 - 10.5 K/uL   RBC 4.74 3.87 - 5.11 MIL/uL   Hemoglobin 14.4 12.0 - 15.0 g/dL   HCT 41.8 36.0 - 46.0 %   MCV 88.2 78.0 - 100.0 fL   MCH 30.4 26.0 - 34.0 pg   MCHC 34.4 30.0 - 36.0 g/dL   RDW 12.9 11.5 - 15.5 %   Platelets 315 150 - 400 K/uL   Neutrophils Relative % 87 %   Neutro Abs 8.1 (H) 1.7 - 7.7 K/uL   Lymphocytes Relative 10 %   Lymphs Abs 0.9 0.7 - 4.0 K/uL   Monocytes Relative 3 %   Monocytes Absolute 0.3 0.1 - 1.0 K/uL   Eosinophils Relative 0 %   Eosinophils Absolute 0.0 0.0 - 0.7 K/uL   Basophils Relative 0 %   Basophils Absolute 0.0 0.0 - 0.1 K/uL  Lipase, blood     Status: None   Collection Time: 09/03/17 10:36 PM  Result Value Ref Range   Lipase 45 11 - 51 U/L  Comprehensive metabolic panel     Status: Abnormal   Collection Time: 09/03/17 10:37 PM  Result Value Ref Range   Sodium 139 135 - 145 mmol/L   Potassium 4.2 3.5 - 5.1 mmol/L   Chloride 106 101 - 111 mmol/L   CO2 25 22 - 32 mmol/L   Glucose, Bld 148 (H) 65 - 99 mg/dL   BUN 11 6 - 20 mg/dL   Creatinine, Ser 0.45 0.44 - 1.00 mg/dL   Calcium 9.6 8.9 - 10.3 mg/dL   Total Protein 8.3 (H)  6.5 - 8.1 g/dL   Albumin 4.4 3.5 - 5.0 g/dL   AST 614 (H) 15 - 41 U/L   ALT 1,638 (H) 14 -  54 U/L   Alkaline Phosphatase 235 (H) 38 - 126 U/L   Total Bilirubin 6.0 (H) 0.3 - 1.2 mg/dL   GFR calc non Af Amer >60 >60 mL/min   GFR calc Af Amer >60 >60 mL/min    Comment: (NOTE) The eGFR has been calculated using the CKD EPI equation. This calculation has not been validated in all clinical situations. eGFR's persistently <60 mL/min signify possible Chronic Kidney Disease.    Anion gap 8 5 - 15  Bilirubin, direct     Status: Abnormal   Collection Time: 09/04/17  4:35 AM  Result Value Ref Range   Bilirubin, Direct 2.3 (H) 0.1 - 0.5 mg/dL  Comprehensive metabolic panel     Status: Abnormal   Collection Time: 09/04/17  4:35 AM  Result Value Ref Range   Sodium 138 135 - 145 mmol/L   Potassium 3.4 (L) 3.5 - 5.1 mmol/L    Comment: DELTA CHECK NOTED   Chloride 103 101 - 111 mmol/L   CO2 26 22 - 32 mmol/L   Glucose, Bld 113 (H) 65 - 99 mg/dL   BUN 10 6 - 20 mg/dL   Creatinine, Ser 0.57 0.44 - 1.00 mg/dL   Calcium 9.4 8.9 - 10.3 mg/dL   Total Protein 8.2 (H) 6.5 - 8.1 g/dL   Albumin 4.3 3.5 - 5.0 g/dL   AST 464 (H) 15 - 41 U/L   ALT 1,508 (H) 14 - 54 U/L   Alkaline Phosphatase 234 (H) 38 - 126 U/L   Total Bilirubin 3.9 (H) 0.3 - 1.2 mg/dL   GFR calc non Af Amer >60 >60 mL/min   GFR calc Af Amer >60 >60 mL/min    Comment: (NOTE) The eGFR has been calculated using the CKD EPI equation. This calculation has not been validated in all clinical situations. eGFR's persistently <60 mL/min signify possible Chronic Kidney Disease.    Anion gap 9 5 - 15  Protime-INR     Status: None   Collection Time: 09/04/17  4:35 AM  Result Value Ref Range   Prothrombin Time 14.0 11.4 - 15.2 seconds   INR 1.09   APTT     Status: None   Collection Time: 09/04/17  4:35 AM  Result Value Ref Range   aPTT 35 24 - 36 seconds  Ammonia     Status: Abnormal   Collection Time: 09/04/17  4:36 AM  Result Value  Ref Range   Ammonia 53 (H) 9 - 35 umol/L    Dg Chest 2 View  Result Date: 09/03/2017 CLINICAL DATA:  26 y/o  F; abdominal pain, nausea, vomiting. EXAM: CHEST  2 VIEW COMPARISON:  None. FINDINGS: The heart size and mediastinal contours are within normal limits. Both lungs are clear. The visualized skeletal structures are unremarkable. IMPRESSION: No active cardiopulmonary disease. Electronically Signed   By: Kristine Garbe M.D.   On: 09/03/2017 23:44   US Abdomen Complete  Result Date: 09/04/2017 CLINICAL DATA:  Elevated LFTs and abdominal pain for the past 3 days. Evaluate for cholecystitis. EXAM: ABDOMEN ULTRASOUND COMPLETE COMPARISON:  CT abdomen pelvis -09/03/2017 FINDINGS: Gallbladder: There are multiple echogenic stones seen throughout the gallbladder. Suspected trace amount of pericholecystic fluid (image 4). No gallbladder wall thickening. Negative sonographic Murphy's sign. Common bile duct: Diameter: Enlarged measuring 1.1 cm in diameter, the etiology of which is not depicted on this examination peer Liver: There is mild increased echogenicity of the hepatic parenchyma. Suspected minimal amount of focal fatty sparing adjacent to the  gallbladder fossa. No discrete hepatic lesions. No intrahepatic biliary ductal dilatation. No ascites. Portal vein is patent on color Doppler imaging with normal direction of blood flow towards the liver. IVC: No abnormality visualized. Pancreas: Limited visualization of the pancreatic head and neck is normal. Visualization of the pancreatic body and tail is obscured by bowel gas. Spleen: Normal in size measuring 7.3 cm in length Right Kidney: Normal cortical thickness, echogenicity and size, measuring 9.9 cm in length. No focal renal lesions. No echogenic renal stones. No urinary obstruction. Left Kidney: Normal cortical thickness, echogenicity and size, measuring 11.7 cm in length. No focal renal lesions. No echogenic renal stones. No urinary obstruction.  Abdominal aorta: No aneurysm visualized. Other findings: None. IMPRESSION: 1. Extensive cholelithiasis with indeterminate findings of acute cholecystitis with trace amount of pericholecystic fluid the no gallbladder wall thickening and negative sonographic Murphy's sign. If clinical concern persists for acute cholecystitis, further evaluation with nuclear medicine HIDA scan could be performed as indicated. 2. Dilated common bile duct, the etiology of which is not depicted on this examination. If cholecystectomy with intraoperative cholangiogram is not pursued, further evaluation with MRCP or ERCP could be performed as indicated. 3. Suspected hepatic steatosis. Electronically Signed   By: Sandi Mariscal M.D.   On: 09/04/2017 09:44   Ct Abdomen Pelvis W Contrast  Result Date: 09/04/2017 CLINICAL DATA:  26 y/o F; sharp intermittent right upper abdominal pain. EXAM: CT ABDOMEN AND PELVIS WITH CONTRAST TECHNIQUE: Multidetector CT imaging of the abdomen and pelvis was performed using the standard protocol following bolus administration of intravenous contrast. CONTRAST:  134m ISOVUE-300 IOPAMIDOL (ISOVUE-300) INJECTION 61% COMPARISON:  None. FINDINGS: Lower chest: No acute abnormality. Hepatobiliary: No focal liver lesion. Cholelithiasis. Dilated common bile duct to 11 mm and mild intrahepatic biliary ductal dilatation, no obstructing stone or mass identified. No pancreatic ductal dilatation. Pancreas: Unremarkable. No pancreatic ductal dilatation or surrounding inflammatory changes. Spleen: Normal in size without focal abnormality. Adrenals/Urinary Tract: Adrenal glands are unremarkable. Kidneys are normal, without renal calculi, focal lesion, or hydronephrosis. Bladder is unremarkable. Stomach/Bowel: Stomach is within normal limits. Appendix appears normal. No evidence of bowel wall thickening, distention, or inflammatory changes. Vascular/Lymphatic: No significant vascular findings are present. No enlarged abdominal  or pelvic lymph nodes. Reproductive: Uterus and bilateral adnexa are unremarkable. Other: No abdominal wall hernia or abnormality. No abdominopelvic ascites. Musculoskeletal: No acute or significant osseous findings. IMPRESSION: 1. Dilated common bile duct to 11 mm and mild intrahepatic biliary ductal dilatation. No obstructing stone or mass identified. When the patient is clinically stable, able to follow directions, and hold their breath (preferably as an outpatient) further evaluation with dedicated abdominal MRI/MRCP should be considered. 2. Cholelithiasis. 3. Otherwise unremarkable CT of abdomen and pelvis. Electronically Signed   By: LKristine GarbeM.D.   On: 09/04/2017 00:22     Assessment & Plan:  MShauna Bodkinsis a 26y.o. female with cholelithiasis and likely choledocholithiasis given her signs and symptoms. She is undergoing ERCP today with Dr. RLaural Golden  -OR tomorrow for laparoscopic cholecystectomy pending no unusual findings on ERCP, will follow up with Dr. RLaural Golden -Repeat LFTs in the AM  -CXR 11/10 wnl, no EKG needed  -Preop orders done  -NPO at midnight for surgery   All questions were answered to the satisfaction of the patient and family.  PLAN: I counseled the patient about the indication, risks and benefits of laparoscopic cholecystectomy.  She understands there is a very small chance for bleeding, infection, injury to  normal structures (including common bile duct), conversion to open surgery, persistent symptoms, evolution of postcholecystectomy diarrhea, need for secondary interventions, anesthesia reaction, cardiopulmonary issues and other risks not specifically detailed here. I described the expected recovery, the plan for follow-up and the restrictions during the recovery phase.  All questions were answered.    Virl Cagey 09/04/2017, 10:59 AM

## 2017-09-04 NOTE — ED Provider Notes (Signed)
12:30 AM patient left at change of shift to get results of her CT scan.  Patient has very elevated LFTs.  Gallbladder disease was suspected.  After reviewing her CT scan she was given Rocephin 2 g IV for cholecystitis.  12:43 AM  Dr Ardyth GalGaring, hospitalist will admit.    Results for orders placed or performed during the hospital encounter of 09/03/17  Urinalysis, Routine w reflex microscopic  Result Value Ref Range   Color, Urine AMBER (A) YELLOW   APPearance CLEAR CLEAR   Specific Gravity, Urine 1.011 1.005 - 1.030   pH 7.0 5.0 - 8.0   Glucose, UA NEGATIVE NEGATIVE mg/dL   Hgb urine dipstick NEGATIVE NEGATIVE   Bilirubin Urine NEGATIVE NEGATIVE   Ketones, ur NEGATIVE NEGATIVE mg/dL   Protein, ur NEGATIVE NEGATIVE mg/dL   Nitrite NEGATIVE NEGATIVE   Leukocytes, UA TRACE (A) NEGATIVE   RBC / HPF 0-5 0 - 5 RBC/hpf   WBC, UA 0-5 0 - 5 WBC/hpf   Bacteria, UA RARE (A) NONE SEEN   Squamous Epithelial / LPF 0-5 (A) NONE SEEN  Pregnancy, urine  Result Value Ref Range   Preg Test, Ur NEGATIVE NEGATIVE  CBC with Differential  Result Value Ref Range   WBC 9.2 4.0 - 10.5 K/uL   RBC 4.74 3.87 - 5.11 MIL/uL   Hemoglobin 14.4 12.0 - 15.0 g/dL   HCT 91.441.8 78.236.0 - 95.646.0 %   MCV 88.2 78.0 - 100.0 fL   MCH 30.4 26.0 - 34.0 pg   MCHC 34.4 30.0 - 36.0 g/dL   RDW 21.312.9 08.611.5 - 57.815.5 %   Platelets 315 150 - 400 K/uL   Neutrophils Relative % 87 %   Neutro Abs 8.1 (H) 1.7 - 7.7 K/uL   Lymphocytes Relative 10 %   Lymphs Abs 0.9 0.7 - 4.0 K/uL   Monocytes Relative 3 %   Monocytes Absolute 0.3 0.1 - 1.0 K/uL   Eosinophils Relative 0 %   Eosinophils Absolute 0.0 0.0 - 0.7 K/uL   Basophils Relative 0 %   Basophils Absolute 0.0 0.0 - 0.1 K/uL  Lipase, blood  Result Value Ref Range   Lipase 45 11 - 51 U/L  Comprehensive metabolic panel  Result Value Ref Range   Sodium 139 135 - 145 mmol/L   Potassium 4.2 3.5 - 5.1 mmol/L   Chloride 106 101 - 111 mmol/L   CO2 25 22 - 32 mmol/L   Glucose, Bld 148 (H)  65 - 99 mg/dL   BUN 11 6 - 20 mg/dL   Creatinine, Ser 4.690.45 0.44 - 1.00 mg/dL   Calcium 9.6 8.9 - 62.910.3 mg/dL   Total Protein 8.3 (H) 6.5 - 8.1 g/dL   Albumin 4.4 3.5 - 5.0 g/dL   AST 528614 (H) 15 - 41 U/L   ALT 1,638 (H) 14 - 54 U/L   Alkaline Phosphatase 235 (H) 38 - 126 U/L   Total Bilirubin 6.0 (H) 0.3 - 1.2 mg/dL   GFR calc non Af Amer >60 >60 mL/min   GFR calc Af Amer >60 >60 mL/min   Anion gap 8 5 - 15   Laboratory interpretation all normal except very elevated LFT's    Dg Chest 2 View  Result Date: 09/03/2017 CLINICAL DATA:  26 y/o  F; abdominal pain, nausea, vomiting. EXAM: CHEST  2 VIEW COMPARISON:  None. FINDINGS: The heart size and mediastinal contours are within normal limits. Both lungs are clear. The visualized skeletal structures are unremarkable. IMPRESSION: No active  cardiopulmonary disease. Electronically Signed   By: Mitzi HansenLance  Furusawa-Stratton M.D.   On: 09/03/2017 23:44   Ct Abdomen Pelvis W Contrast  Result Date: 09/04/2017 CLINICAL DATA:  26 y/o F; sharp intermittent right upper abdominal pain. EXAM: CT ABDOMEN AND PELVIS WITH CONTRAST TECHNIQUE: Multidetector CT imaging of the abdomen and pelvis was performed using the standard protocol following bolus administration of intravenous contrast. CONTRAST:  100mL ISOVUE-300 IOPAMIDOL (ISOVUE-300) INJECTION 61% COMPARISON:  None. FINDINGS: Lower chest: No acute abnormality. Hepatobiliary: No focal liver lesion. Cholelithiasis. Dilated common bile duct to 11 mm and mild intrahepatic biliary ductal dilatation, no obstructing stone or mass identified. No pancreatic ductal dilatation. Pancreas: Unremarkable. No pancreatic ductal dilatation or surrounding inflammatory changes. Spleen: Normal in size without focal abnormality. Adrenals/Urinary Tract: Adrenal glands are unremarkable. Kidneys are normal, without renal calculi, focal lesion, or hydronephrosis. Bladder is unremarkable. Stomach/Bowel: Stomach is within normal limits.  Appendix appears normal. No evidence of bowel wall thickening, distention, or inflammatory changes. Vascular/Lymphatic: No significant vascular findings are present. No enlarged abdominal or pelvic lymph nodes. Reproductive: Uterus and bilateral adnexa are unremarkable. Other: No abdominal wall hernia or abnormality. No abdominopelvic ascites. Musculoskeletal: No acute or significant osseous findings. IMPRESSION: 1. Dilated common bile duct to 11 mm and mild intrahepatic biliary ductal dilatation. No obstructing stone or mass identified. When the patient is clinically stable, able to follow directions, and hold their breath (preferably as an outpatient) further evaluation with dedicated abdominal MRI/MRCP should be considered. 2. Cholelithiasis. 3. Otherwise unremarkable CT of abdomen and pelvis. Electronically Signed   By: Mitzi HansenLance  Furusawa-Stratton M.D.   On: 09/04/2017 00:22   Diagnoses that have been ruled out:  None  Diagnoses that are still under consideration:  None  Final diagnoses:  Right upper quadrant abdominal pain  Elevated LFTs  Nausea and vomiting in adult  Cholelithiasis with choledocholithiasis  Cholecystitis, acute   Plan admission  Devoria AlbeIva Alieah Brinton, MD, Concha PyoFACEP    Wardell Pokorski, MD 09/04/17 [phone]

## 2017-09-04 NOTE — H&P (View-Only) (Signed)
[facility] Consult  Reason for Consult: Choledocholithiasis  Referring Physician:  Dr. Broadus John, Dr. Laural Golden   Chief Complaint    Abdominal Pain      Kelly Mahoney is a 26 y.o. female.  HPI: Kelly Mahoney is a very pleasant 26 yo female with likely choledocholithiasis and known cholelithiasis on [name]. She came into the ED with jaundice, complaints of RUQ abdominal pain and nausea/vomiting for 3 days. She as found to have an enlarged CBD on imaging, measuring 28m and CT and [place] but given the labs and other findings it is very likely. She is otherwise healthy, and only has been treated for GERD for the last month after having abdominal pain at night.  She has had 1 child vaginally and no other surgeries.  Dr. RLaural Goldenis taking her for ERCP today.  Currently her pain is controlled, and she has no nausea/vomiting.   Of note she had a pruritic rash a few days ago and was give a prednisone taper, she ports that she did have a rash and was not just itching which would go along with the hyperbilirubinemia.   Past Medical History:  Diagnosis Date  . GERD (gastroesophageal reflux disease)     Past Surgical History:  Procedure Laterality Date  . NO PAST SURGERIES      Family History  Problem Relation Age of Onset  . Hyperlipidemia Father   . Hyperlipidemia Mother   . Liver disease Mother        Fatty Liver, possibly elevated LFTs    Social History   Tobacco Use  . Smoking status: Never Smoker  . Smokeless tobacco: Never Used  Substance Use Topics  . Alcohol use: No  . Drug use: No    Medications:  I have reviewed the patient's current medications. Prior to Admission:  Medications Prior to Admission  Medication Sig Dispense Refill Last Dose  . albuterol (PROVENTIL HFA;VENTOLIN HFA) 108 (90 Base) MCG/ACT inhaler Inhale 1 puff into the lungs every 6 (six) hours as needed for wheezing or shortness of breath.   unknown  . etonogestrel (NEXPLANON)  68 MG IMPL implant 1 each once by Subdermal route.   current  . hydrOXYzine (ATARAX/VISTARIL) 25 MG tablet Take 25 mg every 8 (eight) hours as needed by mouth for itching.   09/03/2017 at Unknown time  . omeprazole (PRILOSEC) 20 MG capsule Take 20 mg daily by mouth.  4 09/03/2017 at Unknown time  . ondansetron (ZOFRAN-ODT) 4 MG disintegrating tablet Take 4 mg every 8 (eight) hours as needed by mouth for nausea or vomiting.   09/02/2017 at Unknown time  . predniSONE (DELTASONE) 10 MG tablet Take 10-50 mg See admin instructions by mouth. Starting on 09-02-2017 take five tablets on day 1, then four on day 2, then three on day 3, then two on day 4, then one on day 5, then stop   09/03/2017 at Unknown time  . ranitidine (ZANTAC) 300 MG tablet Take 300 mg at bedtime by mouth.   09/02/2017 at Unknown time  . ibuprofen (ADVIL,MOTRIN) 600 MG tablet Take 1 tablet (600 mg total) by mouth every 6 (six) hours. (Patient not taking: Reported on 05/13/2017) 30 tablet 0 Not Taking   Scheduled: . famotidine  20 mg Oral BID  . pantoprazole  40 mg Oral Daily   Continuous: . sodium chloride 100 mL/hr at 09/04/17 0644   Allergies: No Known Allergies   ROS:  A comprehensive review of systems was negative except  for: Ears, nose, mouth, throat, and face: positive for yellow eyes Gastrointestinal: positive for abdominal pain, nausea, vomiting and pale stools, dark urine  Blood pressure (!) 142/71, pulse 62, temperature 97.9 F (36.6 C), temperature source Oral, resp. rate 16, height '5\' 2"'  (1.575 m), weight 191 lb (86.6 kg), last menstrual period 08/13/2017, SpO2 100 %, currently breastfeeding. Physical Exam  Results: Results for orders placed or performed during the hospital encounter of 09/03/17 (from the past 48 hour(s))  Urinalysis, Routine w reflex microscopic     Status: Abnormal   Collection Time: 09/03/17  9:01 PM  Result Value Ref Range   Color, Urine AMBER (A) YELLOW    Comment: BIOCHEMICALS MAY BE  AFFECTED BY COLOR   APPearance CLEAR CLEAR   Specific Gravity, Urine 1.011 1.005 - 1.030   pH 7.0 5.0 - 8.0   Glucose, UA NEGATIVE NEGATIVE mg/dL   Hgb urine dipstick NEGATIVE NEGATIVE   Bilirubin Urine NEGATIVE NEGATIVE   Ketones, ur NEGATIVE NEGATIVE mg/dL   Protein, ur NEGATIVE NEGATIVE mg/dL   Nitrite NEGATIVE NEGATIVE   Leukocytes, UA TRACE (A) NEGATIVE   RBC / HPF 0-5 0 - 5 RBC/hpf   WBC, UA 0-5 0 - 5 WBC/hpf   Bacteria, UA RARE (A) NONE SEEN   Squamous Epithelial / LPF 0-5 (A) NONE SEEN  Pregnancy, urine     Status: None   Collection Time: 09/03/17  9:01 PM  Result Value Ref Range   Preg Test, Ur NEGATIVE NEGATIVE    Comment:        THE SENSITIVITY OF THIS METHODOLOGY IS >20 mIU/mL.   CBC with Differential     Status: Abnormal   Collection Time: 09/03/17 10:36 PM  Result Value Ref Range   WBC 9.2 4.0 - 10.5 K/uL   RBC 4.74 3.87 - 5.11 MIL/uL   Hemoglobin 14.4 12.0 - 15.0 g/dL   HCT 41.8 36.0 - 46.0 %   MCV 88.2 78.0 - 100.0 fL   MCH 30.4 26.0 - 34.0 pg   MCHC 34.4 30.0 - 36.0 g/dL   RDW 12.9 11.5 - 15.5 %   Platelets 315 150 - 400 K/uL   Neutrophils Relative % 87 %   Neutro Abs 8.1 (H) 1.7 - 7.7 K/uL   Lymphocytes Relative 10 %   Lymphs Abs 0.9 0.7 - 4.0 K/uL   Monocytes Relative 3 %   Monocytes Absolute 0.3 0.1 - 1.0 K/uL   Eosinophils Relative 0 %   Eosinophils Absolute 0.0 0.0 - 0.7 K/uL   Basophils Relative 0 %   Basophils Absolute 0.0 0.0 - 0.1 K/uL  Lipase, blood     Status: None   Collection Time: 09/03/17 10:36 PM  Result Value Ref Range   Lipase 45 11 - 51 U/L  Comprehensive metabolic panel     Status: Abnormal   Collection Time: 09/03/17 10:37 PM  Result Value Ref Range   Sodium 139 135 - 145 mmol/L   Potassium 4.2 3.5 - 5.1 mmol/L   Chloride 106 101 - 111 mmol/L   CO2 25 22 - 32 mmol/L   Glucose, Bld 148 (H) 65 - 99 mg/dL   BUN 11 6 - 20 mg/dL   Creatinine, Ser 0.45 0.44 - 1.00 mg/dL   Calcium 9.6 8.9 - 10.3 mg/dL   Total Protein 8.3 (H)  6.5 - 8.1 g/dL   Albumin 4.4 3.5 - 5.0 g/dL   AST 614 (H) 15 - 41 U/L   ALT 1,638 (H) 14 -  54 U/L   Alkaline Phosphatase 235 (H) 38 - 126 U/L   Total Bilirubin 6.0 (H) 0.3 - 1.2 mg/dL   GFR calc non Af Amer >60 >60 mL/min   GFR calc Af Amer >60 >60 mL/min    Comment: (NOTE) The eGFR has been calculated using the CKD EPI equation. This calculation has not been validated in all clinical situations. eGFR's persistently <60 mL/min signify possible Chronic Kidney Disease.    Anion gap 8 5 - 15  Bilirubin, direct     Status: Abnormal   Collection Time: 09/04/17  4:35 AM  Result Value Ref Range   Bilirubin, Direct 2.3 (H) 0.1 - 0.5 mg/dL  Comprehensive metabolic panel     Status: Abnormal   Collection Time: 09/04/17  4:35 AM  Result Value Ref Range   Sodium 138 135 - 145 mmol/L   Potassium 3.4 (L) 3.5 - 5.1 mmol/L    Comment: DELTA CHECK NOTED   Chloride 103 101 - 111 mmol/L   CO2 26 22 - 32 mmol/L   Glucose, Bld 113 (H) 65 - 99 mg/dL   BUN 10 6 - 20 mg/dL   Creatinine, Ser 0.57 0.44 - 1.00 mg/dL   Calcium 9.4 8.9 - 10.3 mg/dL   Total Protein 8.2 (H) 6.5 - 8.1 g/dL   Albumin 4.3 3.5 - 5.0 g/dL   AST 464 (H) 15 - 41 U/L   ALT 1,508 (H) 14 - 54 U/L   Alkaline Phosphatase 234 (H) 38 - 126 U/L   Total Bilirubin 3.9 (H) 0.3 - 1.2 mg/dL   GFR calc non Af Amer >60 >60 mL/min   GFR calc Af Amer >60 >60 mL/min    Comment: (NOTE) The eGFR has been calculated using the CKD EPI equation. This calculation has not been validated in all clinical situations. eGFR's persistently <60 mL/min signify possible Chronic Kidney Disease.    Anion gap 9 5 - 15  Protime-INR     Status: None   Collection Time: 09/04/17  4:35 AM  Result Value Ref Range   Prothrombin Time 14.0 11.4 - 15.2 seconds   INR 1.09   APTT     Status: None   Collection Time: 09/04/17  4:35 AM  Result Value Ref Range   aPTT 35 24 - 36 seconds  Ammonia     Status: Abnormal   Collection Time: 09/04/17  4:36 AM  Result Value  Ref Range   Ammonia 53 (H) 9 - 35 umol/L    Dg Chest 2 View  Result Date: 09/03/2017 CLINICAL DATA:  26 y/o  F; abdominal pain, nausea, vomiting. EXAM: CHEST  2 VIEW COMPARISON:  None. FINDINGS: The heart size and mediastinal contours are within normal limits. Both lungs are clear. The visualized skeletal structures are unremarkable. IMPRESSION: No active cardiopulmonary disease. Electronically Signed   By: Kristine Garbe M.D.   On: 09/03/2017 23:44   US Abdomen Complete  Result Date: 09/04/2017 CLINICAL DATA:  Elevated LFTs and abdominal pain for the past 3 days. Evaluate for cholecystitis. EXAM: ABDOMEN ULTRASOUND COMPLETE COMPARISON:  CT abdomen pelvis -09/03/2017 FINDINGS: Gallbladder: There are multiple echogenic stones seen throughout the gallbladder. Suspected trace amount of pericholecystic fluid (image 4). No gallbladder wall thickening. Negative sonographic Murphy's sign. Common bile duct: Diameter: Enlarged measuring 1.1 cm in diameter, the etiology of which is not depicted on this examination peer Liver: There is mild increased echogenicity of the hepatic parenchyma. Suspected minimal amount of focal fatty sparing adjacent to the  gallbladder fossa. No discrete hepatic lesions. No intrahepatic biliary ductal dilatation. No ascites. Portal vein is patent on color Doppler imaging with normal direction of blood flow towards the liver. IVC: No abnormality visualized. Pancreas: Limited visualization of the pancreatic head and neck is normal. Visualization of the pancreatic body and tail is obscured by bowel gas. Spleen: Normal in size measuring 7.3 cm in length Right Kidney: Normal cortical thickness, echogenicity and size, measuring 9.9 cm in length. No focal renal lesions. No echogenic renal stones. No urinary obstruction. Left Kidney: Normal cortical thickness, echogenicity and size, measuring 11.7 cm in length. No focal renal lesions. No echogenic renal stones. No urinary obstruction.  Abdominal aorta: No aneurysm visualized. Other findings: None. IMPRESSION: 1. Extensive cholelithiasis with indeterminate findings of acute cholecystitis with trace amount of pericholecystic fluid the no gallbladder wall thickening and negative sonographic Murphy's sign. If clinical concern persists for acute cholecystitis, further evaluation with nuclear medicine HIDA scan could be performed as indicated. 2. Dilated common bile duct, the etiology of which is not depicted on this examination. If cholecystectomy with intraoperative cholangiogram is not pursued, further evaluation with MRCP or ERCP could be performed as indicated. 3. Suspected hepatic steatosis. Electronically Signed   By: Sandi Mariscal M.D.   On: 09/04/2017 09:44   Ct Abdomen Pelvis W Contrast  Result Date: 09/04/2017 CLINICAL DATA:  26 y/o F; sharp intermittent right upper abdominal pain. EXAM: CT ABDOMEN AND PELVIS WITH CONTRAST TECHNIQUE: Multidetector CT imaging of the abdomen and pelvis was performed using the standard protocol following bolus administration of intravenous contrast. CONTRAST:  175m ISOVUE-300 IOPAMIDOL (ISOVUE-300) INJECTION 61% COMPARISON:  None. FINDINGS: Lower chest: No acute abnormality. Hepatobiliary: No focal liver lesion. Cholelithiasis. Dilated common bile duct to 11 mm and mild intrahepatic biliary ductal dilatation, no obstructing stone or mass identified. No pancreatic ductal dilatation. Pancreas: Unremarkable. No pancreatic ductal dilatation or surrounding inflammatory changes. Spleen: Normal in size without focal abnormality. Adrenals/Urinary Tract: Adrenal glands are unremarkable. Kidneys are normal, without renal calculi, focal lesion, or hydronephrosis. Bladder is unremarkable. Stomach/Bowel: Stomach is within normal limits. Appendix appears normal. No evidence of bowel wall thickening, distention, or inflammatory changes. Vascular/Lymphatic: No significant vascular findings are present. No enlarged abdominal  or pelvic lymph nodes. Reproductive: Uterus and bilateral adnexa are unremarkable. Other: No abdominal wall hernia or abnormality. No abdominopelvic ascites. Musculoskeletal: No acute or significant osseous findings. IMPRESSION: 1. Dilated common bile duct to 11 mm and mild intrahepatic biliary ductal dilatation. No obstructing stone or mass identified. When the patient is clinically stable, able to follow directions, and hold their breath (preferably as an outpatient) further evaluation with dedicated abdominal MRI/MRCP should be considered. 2. Cholelithiasis. 3. Otherwise unremarkable CT of abdomen and pelvis. Electronically Signed   By: LKristine GarbeM.D.   On: 09/04/2017 00:22     Assessment & Plan:  Kelly Alessandriniis a 26y.o. female with cholelithiasis and likely choledocholithiasis given her signs and symptoms. She is undergoing ERCP today with Dr. RLaural Golden  -OR tomorrow for laparoscopic cholecystectomy pending no unusual findings on ERCP, will follow up with Dr. RLaural Golden -Repeat LFTs in the AM  -CXR 11/10 wnl, no EKG needed  -Preop orders done  -NPO at midnight for surgery   All questions were answered to the satisfaction of the patient and family.  PLAN: I counseled the patient about the indication, risks and benefits of laparoscopic cholecystectomy.  She understands there is a very small chance for bleeding, infection, injury to  normal structures (including common bile duct), conversion to open surgery, persistent symptoms, evolution of postcholecystectomy diarrhea, need for secondary interventions, anesthesia reaction, cardiopulmonary issues and other risks not specifically detailed here. I described the expected recovery, the plan for follow-up and the restrictions during the recovery phase.  All questions were answered.    Virl Cagey 09/04/2017, 10:59 AM

## 2017-09-04 NOTE — Progress Notes (Signed)
Brief ERCP note;  Normal ampulla of Vater. Normal pancreatogram. Mildly dilated CBD and CHD with single stone in the distal CBD. Cholelithiasis. Sphincterotomy performed and stone removed with Dormia basket.

## 2017-09-04 NOTE — Consult Note (Signed)
Referring Provider: Zannie Cove, MD Primary Care Physician:  Lewis Moccasin, MD Primary Gastroenterologist:  Dr. Karilyn Cota  Reason for Consultation:    Jaundice and dilated common bile duct. Cholelithiasis.  HPI:   Patient is 26 year old Hispanic female who is been experiencing intermittent right upper quadrant pain since July 2018.  She states pain started a few weeks after baby was born.  He experienced nausea vomiting and heartburn with these spells.  She was felt to have GERD and begun on medication and she felt better.  However pain returned.  She was seen by her primary care physician earlier this week and blood work reportedly was normal.  She began to have severe pain 2 nights ago.  She also had nausea and vomiting.  And radiated into her back.  She did not experience fever.  She came to emergency room and was noted to be jaundiced.  Her transaminases were elevated.  Abdominopelvic CT was obtained.  Revealed cholelithiasis and dilated bile duct measuring 11 mm.  Ultrasound was obtained this morning and once again bile duct was dilated and no stones identified. Continues to have pain in the right upper quadrant radiating to her back.  She had pain medication 10 minutes ago when the pain is now mild.  She also noted change in the color of urine about 2 days ago.  Urine became dark orange and she also noted light colored to her stools. She denies recent weight loss.  She also denies melena or rectal bleeding diarrhea or constipation. She does not drink alcohol or smoke cigarettes. Family history negative for cholelithiasis.  She has 3 younger sisters who are in good health. 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   Past Medical History:  Diagnosis Date  . GERD (gastroesophageal reflux disease)     Past Surgical History:  Procedure Laterality Date  . NO PAST SURGERIES      Prior to Admission medications   Medication Sig Start Date End Date Taking? Authorizing Provider  albuterol  (PROVENTIL HFA;VENTOLIN HFA) 108 (90 Base) MCG/ACT inhaler Inhale 1 puff into the lungs every 6 (six) hours as needed for wheezing or shortness of breath.   Yes [provider]  etonogestrel (NEXPLANON) 68 MG IMPL implant 1 each once by Subdermal route.   Yes [provider]  hydrOXYzine (ATARAX/VISTARIL) 25 MG tablet Take 25 mg every 8 (eight) hours as needed by mouth for itching.   Yes [provider]  omeprazole (PRILOSEC) 20 MG capsule Take 20 mg daily by mouth. 08/29/17  Yes [provider]  ondansetron (ZOFRAN-ODT) 4 MG disintegrating tablet Take 4 mg every 8 (eight) hours as needed by mouth for nausea or vomiting.   Yes [provider]  predniSONE (DELTASONE) 10 MG tablet Take 10-50 mg See admin instructions by mouth. Starting on 09-02-2017 take five tablets on day 1, then four on day 2, then three on day 3, then two on day 4, then one on day 5, then stop   Yes [provider]  ranitidine (ZANTAC) 300 MG tablet Take 300 mg at bedtime by mouth.   Yes [provider]  ibuprofen (ADVIL,MOTRIN) 600 MG tablet Take 1 tablet (600 mg total) by mouth every 6 (six) hours. Patient not taking: Reported on 05/13/2017 03/31/17   Allie Bossier, MD    Current Facility-Administered Medications  Medication Dose Route Frequency Provider Last Rate Last Dose  . 0.9 %  sodium chloride infusion   Intravenous Continuous Marlow Baars, MD  100 mL/hr at 09/04/17 0644    . famotidine (PEPCID) tablet 20 mg  20 mg Oral BID Marlow BaarsGaring, Kendall, MD   20 mg at 09/04/17 0948  . HYDROmorphone (DILAUDID) injection 0.5 mg  0.5 mg Intravenous Q3H PRN Marlow BaarsGaring, Kendall, MD      . hydrOXYzine (ATARAX/VISTARIL) tablet 25 mg  25 mg Oral Q8H PRN Marlow BaarsGaring, Kendall, MD   25 mg at 09/04/17 0957  . morphine 4 MG/ML injection 4 mg  4 mg Intravenous Q1H PRN Samuel JesterMcManus, Kathleen, DO   4 mg at 09/04/17 0256  . ondansetron (ZOFRAN) injection 4 mg  4 mg Intravenous Q1H PRN Samuel JesterMcManus, Kathleen, DO    4 mg at 09/04/17 1014  . pantoprazole (PROTONIX) EC tablet 40 mg  40 mg Oral Daily Marlow BaarsGaring, Kendall, MD   40 mg at 09/04/17 0948    Allergies as of 09/03/2017  . (No Known Allergies)    Family History  Problem Relation Age of Onset  . Hyperlipidemia Father   . Hyperlipidemia Mother   . Liver disease Mother        Fatty Liver, possibly elevated LFTs    Social History   Socioeconomic History  . Marital status: Married    Spouse name: Not on file  . Number of children: Not on file  . Years of education: Not on file  . Highest education level: Not on file  Social Needs  . Financial resource strain: Not on file  . Food insecurity - worry: Not on file  . Food insecurity - inability: Not on file  . Transportation needs - medical: Not on file  . Transportation needs - non-medical: Not on file  Occupational History  . Not on file  Tobacco Use  . Smoking status: Never Smoker  . Smokeless tobacco: Never Used  Substance and Sexual Activity  . Alcohol use: No  . Drug use: No  . Sexual activity: Yes    Birth control/protection: Surgical    Comment: Explanon  Other Topics Concern  . Not on file  Social History Narrative  . Not on file    Review of Systems: See HPI, otherwise normal ROS  Physical Exam: Temp:  [97.9 F (36.6 C)-98.3 F (36.8 C)] 97.9 F (36.6 C) (11/11 0555) Pulse Rate:  [62-89] 62 (11/11 0555) Resp:  [16-17] 16 (11/11 0555) BP: (125-142)/(62-88) 142/71 (11/11 0555) SpO2:  [96 %-100 %] 100 % (11/11 0555) Weight:  [191 lb (86.6 kg)-195 lb (88.5 kg)] 191 lb (86.6 kg) (11/11 0251) Last BM Date: 09/03/17  Patient is alert and in no acute distress. Conjunctiva is pink and sclera is icteric. Oropharyngeal mucosa is normal. No neck masses or thyromegaly noted. Cardiac exam with regular rhythm normal S1 and S2.  No murmur or gallop noted. Lungs are clear to auscultation. Abdomen is full with linear striatal markings. Bowel sounds are normal.  On palpation  abdomen is soft.  Mild midepigastric tenderness noted.  No organomegaly or masses. No peripheral edema or clubbing noted.   Lab Results: Recent Labs    09/03/17 2236  WBC 9.2  HGB 14.4  HCT 41.8  PLT 315   BMET Recent Labs    09/03/17 2237 09/04/17 0435  NA 139 138  K 4.2 3.4*  CL 106 103  CO2 25 26  GLUCOSE 148* 113*  BUN 11 10  CREATININE 0.45 0.57  CALCIUM 9.6 9.4   LFT Recent Labs    09/04/17 0435  PROT 8.2*  ALBUMIN 4.3  AST 464*  ALT 1,508*  ALKPHOS 234*  BILITOT 3.9*  BILIDIR 2.3*   PT/INR Recent Labs    09/04/17 0435  LABPROT 14.0  INR 1.09   Hepatitis Panel No results for input(s): HEPBSAG, HCVAB, HEPAIGM, HEPBIGM in the last 72 hours.  Studies/Results: Dg Chest 2 View  Result Date: 09/03/2017 CLINICAL DATA:  26 y/o  F; abdominal pain, nausea, vomiting. EXAM: CHEST  2 VIEW COMPARISON:  None. FINDINGS: The heart size and mediastinal contours are within normal limits. Both lungs are clear. The visualized skeletal structures are unremarkable. IMPRESSION: No active cardiopulmonary disease. Electronically Signed   By: Mitzi HansenLance  Furusawa-Stratton M.D.   On: 09/03/2017 23:44   [place] Abdomen Complete  Result Date: 09/04/2017 CLINICAL DATA:  Elevated LFTs and abdominal pain for the past 3 days. Evaluate for cholecystitis. EXAM: ABDOMEN ULTRASOUND COMPLETE COMPARISON:  CT abdomen pelvis -09/03/2017 FINDINGS: Gallbladder: There are multiple echogenic stones seen throughout the gallbladder. Suspected trace amount of pericholecystic fluid (image 4). No gallbladder wall thickening. Negative sonographic Murphy's sign. Common bile duct: Diameter: Enlarged measuring 1.1 cm in diameter, the etiology of which is not depicted on this examination peer Liver: There is mild increased echogenicity of the hepatic parenchyma. Suspected minimal amount of focal fatty sparing adjacent to the gallbladder fossa. No discrete hepatic lesions. No intrahepatic biliary ductal dilatation. No  ascites. Portal vein is patent on color Doppler imaging with normal direction of blood flow towards the liver. IVC: No abnormality visualized. Pancreas: Limited visualization of the pancreatic head and neck is normal. Visualization of the pancreatic body and tail is obscured by bowel gas. Spleen: Normal in size measuring 7.3 cm in length Right Kidney: Normal cortical thickness, echogenicity and size, measuring 9.9 cm in length. No focal renal lesions. No echogenic renal stones. No urinary obstruction. Left Kidney: Normal cortical thickness, echogenicity and size, measuring 11.7 cm in length. No focal renal lesions. No echogenic renal stones. No urinary obstruction. Abdominal aorta: No aneurysm visualized. Other findings: None. IMPRESSION: 1. Extensive cholelithiasis with indeterminate findings of acute cholecystitis with trace amount of pericholecystic fluid the no gallbladder wall thickening and negative sonographic Murphy's sign. If clinical concern persists for acute cholecystitis, further evaluation with nuclear medicine HIDA scan could be performed as indicated. 2. Dilated common bile duct, the etiology of which is not depicted on this examination. If cholecystectomy with intraoperative cholangiogram is not pursued, further evaluation with MRCP or ERCP could be performed as indicated. 3. Suspected hepatic steatosis. Electronically Signed   By: Simonne ComeJohn  Watts M.D.   On: 09/04/2017 09:44   Ct Abdomen Pelvis W Contrast  Result Date: 09/04/2017 CLINICAL DATA:  26 y/o F; sharp intermittent right upper abdominal pain. EXAM: CT ABDOMEN AND PELVIS WITH CONTRAST TECHNIQUE: Multidetector CT imaging of the abdomen and pelvis was performed using the standard protocol following bolus administration of intravenous contrast. CONTRAST:  100mL ISOVUE-300 IOPAMIDOL (ISOVUE-300) INJECTION 61% COMPARISON:  None. FINDINGS: Lower chest: No acute abnormality. Hepatobiliary: No focal liver lesion. Cholelithiasis. Dilated common bile  duct to 11 mm and mild intrahepatic biliary ductal dilatation, no obstructing stone or mass identified. No pancreatic ductal dilatation. Pancreas: Unremarkable. No pancreatic ductal dilatation or surrounding inflammatory changes. Spleen: Normal in size without focal abnormality. Adrenals/Urinary Tract: Adrenal glands are unremarkable. Kidneys are normal, without renal calculi, focal lesion, or hydronephrosis. Bladder is unremarkable. Stomach/Bowel: Stomach is within normal limits. Appendix appears normal. No evidence of bowel wall thickening, distention, or inflammatory changes. Vascular/Lymphatic: No significant vascular findings are present. No enlarged abdominal or  pelvic lymph nodes. Reproductive: Uterus and bilateral adnexa are unremarkable. Other: No abdominal wall hernia or abnormality. No abdominopelvic ascites. Musculoskeletal: No acute or significant osseous findings. IMPRESSION: 1. Dilated common bile duct to 11 mm and mild intrahepatic biliary ductal dilatation. No obstructing stone or mass identified. When the patient is clinically stable, able to follow directions, and hold their breath (preferably as an outpatient) further evaluation with dedicated abdominal MRI/MRCP should be considered. 2. Cholelithiasis. 3. Otherwise unremarkable CT of abdomen and pelvis. Electronically Signed   By: Mitzi Hansen M.D.   On: 09/04/2017 00:22   I have reviewed CT and ultrasound.  Assessment; Patient is 26 year old Hispanic female who presents with 26-month history of intermittent right upper quadrant abdominal pain and now she has jaundice and markedly elevated transaminases evidence of cholelithiasis and dilated bile duct on CT and ultrasound.  Review of the studies suggest that there may be a stone in the distal most segment of the bile duct.  He needs to have a bile duct cleared of stones prior to cholecystectomy.  Clinically she does not appear to have cholangitis or pancreatitis. She received 2 g  of ceftriaxone about 12 hours ago.  Recommendations;  ERCP with sphincterotomy and stone extraction. I have reviewed the procedure risks with the patient and her mother. They are both agreeable. Procedure will be performed this afternoon.   LOS: 0 days   Najeeb Rehman  09/04/2017, 10:41 AM

## 2017-09-04 NOTE — Progress Notes (Signed)
PROGRESS NOTE    Kelly Mahoney  WUJ:[id number] DOB: 03-05-1991 DOA: 09/03/2017 PCP: Lewis Moccasinewey, Elizabeth R, MD  Brief Narrative: 26 year old female with no past medical history admitted this morning with abdominal pain found to have choledocholithiasis and cholelithiasis, GI and general surgery consulting   Assessment & Plan:   Principal Problem:   Cholelithiasis with choledocholithiasis - Bilirubin improving today, -Gastroenterology and general surgery consulted -Plan for ERCP today -Possible laparoscopic cholecystectomy tomorrow -Right upper quadrant ultrasound to rule out cholecystitis   Hyperglycemia -Check hemoglobin A1c -Could have borderline diabetes or could be stress-induced increased cortisol release mediated  Obesity -Recommended lifestyle modification -Check hemoglobin A1c, suspect metabolic syndrome  DVT prophylaxis: lovenox held for ERCP and OR tomorrow Code Status: Full Code Family Communication: Mother at bedside Disposition Plan: Home in 1-2 days  Consultants:   Dr.Rehman-GI  Surgery Dr.Bridges   Procedures:   Antimicrobials:  Ceftriaxone x1  Subjective: Pain better after meds, no N/V this am  Objective: Vitals:   09/04/17 1211 09/04/17 1215 09/04/17 1221 09/04/17 1226  BP: 136/68 128/61 134/61 139/72  Pulse: 88 74 66 78  Resp: 16 16 16 16   Temp: 98.2 F (36.8 C)     TempSrc:      SpO2: 100% 100% 100% 100%  Weight:      Height:        Intake/Output Summary (Last 24 hours) at 09/04/2017 1258 Last data filed at 09/04/2017 1256 Gross per 24 hour  Intake 550 ml  Output -  Net 550 ml   Filed Weights   09/03/17 1951 09/04/17 0200 09/04/17 0251  Weight: 88.5 kg (195 lb) 86.6 kg (191 lb) 86.6 kg (191 lb)    Examination:  General exam: Appears calm and comfortable obese female, no distress  Respiratory system: Clear to auscultation. Respiratory effort normal. Cardiovascular system: S1 & S2 heard, RRR. No JVD, murmurs, rubs, gallops    Gastrointestinal system: Abdomen is nondistended, soft and tender in the right upper quadrant, Normal bowel sounds heard. Central nervous system: Alert and oriented. No focal neurological deficits. Extremities: Symmetric 5 x 5 power. Skin: No rashes, lesions or ulcers Psychiatry: Judgement and insight appear normal. Mood & affect appropriate.     Data Reviewed:   CBC: Recent Labs  Lab 09/03/17 2236  WBC 9.2  NEUTROABS 8.1*  HGB 14.4  HCT 41.8  MCV 88.2  PLT 315   Basic Metabolic Panel: Recent Labs  Lab 09/03/17 2237 09/04/17 0435  NA 139 138  K 4.2 3.4*  CL 106 103  CO2 25 26  GLUCOSE 148* 113*  BUN 11 10  CREATININE 0.45 0.57  CALCIUM 9.6 9.4   GFR: Estimated Creatinine Clearance: 109.8 mL/min (by C-G formula based on SCr of 0.57 mg/dL). Liver Function Tests: Recent Labs  Lab 09/03/17 2237 09/04/17 0435  AST 614* 464*  ALT 1,638* 1,508*  ALKPHOS 235* 234*  BILITOT 6.0* 3.9*  PROT 8.3* 8.2*  ALBUMIN 4.4 4.3   Recent Labs  Lab 09/03/17 2236  LIPASE 45   Recent Labs  Lab 09/04/17 0436  AMMONIA 53*   Coagulation Profile: Recent Labs  Lab 09/04/17 0435  INR 1.09   Cardiac Enzymes: No results for input(s): CKTOTAL, CKMB, CKMBINDEX, TROPONINI in the last 168 hours. BNP (last 3 results) No results for input(s): PROBNP in the last 8760 hours. HbA1C: No results for input(s): HGBA1C in the last 72 hours. CBG: No results for input(s): GLUCAP in the last 168 hours. Lipid Profile: No results for input(s): CHOL,  HDL, LDLCALC, TRIG, CHOLHDL, LDLDIRECT in the last 72 hours. Thyroid Function Tests: No results for input(s): TSH, T4TOTAL, FREET4, T3FREE, THYROIDAB in the last 72 hours. Anemia Panel: No results for input(s): VITAMINB12, FOLATE, FERRITIN, TIBC, IRON, RETICCTPCT in the last 72 hours. Urine analysis:    Component Value Date/Time   COLORURINE AMBER (A) 09/03/2017 2101   APPEARANCEUR CLEAR 09/03/2017 2101   APPEARANCEUR Clear 08/16/2016  1517   LABSPEC 1.011 09/03/2017 2101   PHURINE 7.0 09/03/2017 2101   GLUCOSEU NEGATIVE 09/03/2017 2101   HGBUR NEGATIVE 09/03/2017 2101   BILIRUBINUR NEGATIVE 09/03/2017 2101   BILIRUBINUR Negative 08/16/2016 1517   KETONESUR NEGATIVE 09/03/2017 2101   PROTEINUR NEGATIVE 09/03/2017 2101   NITRITE NEGATIVE 09/03/2017 2101   LEUKOCYTESUR TRACE (A) 09/03/2017 2101   LEUKOCYTESUR 1+ (A) 08/16/2016 1517   Sepsis Labs: @LABRCNTIP (procalcitonin:4,lacticidven:4)  )No results found for this or any previous visit (from the past 240 hour(s)).       Radiology Studies: Dg Chest 2 View  Result Date: 09/03/2017 CLINICAL DATA:  26 y/o  F; abdominal pain, nausea, vomiting. EXAM: CHEST  2 VIEW COMPARISON:  None. FINDINGS: The heart size and mediastinal contours are within normal limits. Both lungs are clear. The visualized skeletal structures are unremarkable. IMPRESSION: No active cardiopulmonary disease. Electronically Signed   By: Mitzi HansenLance  Furusawa-Stratton M.D.   On: 09/03/2017 23:44   [place] Abdomen Complete  Result Date: 09/04/2017 CLINICAL DATA:  Elevated LFTs and abdominal pain for the past 3 days. Evaluate for cholecystitis. EXAM: ABDOMEN ULTRASOUND COMPLETE COMPARISON:  CT abdomen pelvis -09/03/2017 FINDINGS: Gallbladder: There are multiple echogenic stones seen throughout the gallbladder. Suspected trace amount of pericholecystic fluid (image 4). No gallbladder wall thickening. Negative sonographic Murphy's sign. Common bile duct: Diameter: Enlarged measuring 1.1 cm in diameter, the etiology of which is not depicted on this examination peer Liver: There is mild increased echogenicity of the hepatic parenchyma. Suspected minimal amount of focal fatty sparing adjacent to the gallbladder fossa. No discrete hepatic lesions. No intrahepatic biliary ductal dilatation. No ascites. Portal vein is patent on color Doppler imaging with normal direction of blood flow towards the liver. IVC: No abnormality  visualized. Pancreas: Limited visualization of the pancreatic head and neck is normal. Visualization of the pancreatic body and tail is obscured by bowel gas. Spleen: Normal in size measuring 7.3 cm in length Right Kidney: Normal cortical thickness, echogenicity and size, measuring 9.9 cm in length. No focal renal lesions. No echogenic renal stones. No urinary obstruction. Left Kidney: Normal cortical thickness, echogenicity and size, measuring 11.7 cm in length. No focal renal lesions. No echogenic renal stones. No urinary obstruction. Abdominal aorta: No aneurysm visualized. Other findings: None. IMPRESSION: 1. Extensive cholelithiasis with indeterminate findings of acute cholecystitis with trace amount of pericholecystic fluid the no gallbladder wall thickening and negative sonographic Murphy's sign. If clinical concern persists for acute cholecystitis, further evaluation with nuclear medicine HIDA scan could be performed as indicated. 2. Dilated common bile duct, the etiology of which is not depicted on this examination. If cholecystectomy with intraoperative cholangiogram is not pursued, further evaluation with MRCP or ERCP could be performed as indicated. 3. Suspected hepatic steatosis. Electronically Signed   By: Simonne ComeJohn  Watts M.D.   On: 09/04/2017 09:44   Ct Abdomen Pelvis W Contrast  Result Date: 09/04/2017 CLINICAL DATA:  26 y/o F; sharp intermittent right upper abdominal pain. EXAM: CT ABDOMEN AND PELVIS WITH CONTRAST TECHNIQUE: Multidetector CT imaging of the abdomen and pelvis was performed using  the standard protocol following bolus administration of intravenous contrast. CONTRAST:  ISOVUE-300 IOPAMIDOL (ISOVUE-300) INJECTION 61% COMPARISON:  None. FINDINGS: Lower chest: No acute abnormality. Hepatobiliary: No focal liver lesion. Cholelithiasis. Dilated common bile duct to 11 mm and mild intrahepatic biliary ductal dilatation, no obstructing stone or mass identified. No pancreatic ductal  dilatation. Pancreas: Unremarkable. No pancreatic ductal dilatation or surrounding inflammatory changes. Spleen: Normal in size without focal abnormality. Adrenals/Urinary Tract: Adrenal glands are unremarkable. Kidneys are normal, without renal calculi, focal lesion, or hydronephrosis. Bladder is unremarkable. Stomach/Bowel: Stomach is within normal limits. Appendix appears normal. No evidence of bowel wall thickening, distention, or inflammatory changes. Vascular/Lymphatic: No significant vascular findings are present. No enlarged abdominal or pelvic lymph nodes. Reproductive: Uterus and bilateral adnexa are unremarkable. Other: No abdominal wall hernia or abnormality. No abdominopelvic ascites. Musculoskeletal: No acute or significant osseous findings. IMPRESSION: 1. Dilated common bile duct to 11 mm and mild intrahepatic biliary ductal dilatation. No obstructing stone or mass identified. When the patient is clinically stable, able to follow directions, and hold their breath (preferably as an outpatient) further evaluation with dedicated abdominal MRI/MRCP should be considered. 2. Cholelithiasis. 3. Otherwise unremarkable CT of abdomen and pelvis. Electronically Signed   By: Mitzi Hansen M.D.   On: 09/04/2017 00:22        Scheduled Meds: . [MAR Hold] famotidine  20 mg Oral BID  . iopamidol      . [MAR Hold] pantoprazole  40 mg Oral Daily   Continuous Infusions: . sodium chloride 100 mL/hr at 09/04/17 0644  . sodium chloride       LOS: 0 days    Time spent:    Zannie Cove, MD [facility] Page via [email or web address], password [email or web address] After 7PM please contact night-coverage  09/04/2017, 12:58 PM

## 2017-09-04 NOTE — Assessment & Plan Note (Signed)
Possible diagnosis. Plan: GI and general surgery consults.

## 2017-09-04 NOTE — H&P (Signed)
Kelly Mahoney is an 26 y.o. female.   Chief Complaint: abdominal pain  Patient was evaluated, examined, and admitted in the early morning on 09/04/2017.  HPI:  PRIMARY CARE PROVIDER: Dr. Rachell Cipro, [place], [place]  HPI: The patient is a 26 yo woman with recent diagnosis of GERD who presents with abdominal pain. She started having abdominal pain 1 month ago that was primarily at night. She saw her doctor and was diagnosed with GERD and started on ranitidine 300 mg. She started having severe pain on 09/01/17, 3 days ago, and she had a pruritic rash all over. She saw her doctor again and was diagnosed with hives and was given a prednisone taper. The pain continued to be severe so she presented to the emergency department. She has noticed that the whites of her eyes are abnormal color, somewhat yellow, and that her skin is more yellow than her usual skin tone. Onset: Pain started 1 month ago but became severe 3 days ago. Duration: intermittent.  Location: Worst area is RUQ but also has epigastric and periumbilical pain.  Radiation: Right back and also somewhat into entire back. Character: 9/10 at times. Cramping and stabbing. Alleviated by: Nothing. Exacerbated by: Nothing. Associated Symptoms: Nausea. Vomiting. No diarrhea, constipation, or bloody stool. Stool color changed to a pale color almost gray. Urine color much brighter yellow, and dark yellow, like mustard. No fever but had chills. Dizziness. Generalized weakness and fatigue. No sore throat. Had rash that was pruritic but it has resolved while on prednisone.  Treatments: none at home except usual medications.  In the emergency department, the patient had labs drawn that showed markedly elevated LFTs, including: AST 614 ALT 1,638 T bili 6 AP 235    Past Medical History:  Diagnosis Date  . GERD (gastroesophageal reflux disease)     Past Surgical History:  Procedure Laterality Date  . NO PAST SURGERIES      Family History   Problem Relation Age of Onset  . Hyperlipidemia Father   . Hyperlipidemia Mother   . Liver disease Mother        Fatty Liver, possibly elevated LFTs   Social History:  reports that  has never smoked. she has never used smokeless tobacco. She reports that she does not drink alcohol or use drugs.  Allergies: No Known Allergies  No current facility-administered medications on file prior to encounter.    Current Outpatient Medications on File Prior to Encounter  Medication Sig Dispense Refill  . albuterol (PROVENTIL HFA;VENTOLIN HFA) 108 (90 Base) MCG/ACT inhaler Inhale 1 puff into the lungs every 6 (six) hours as needed for wheezing or shortness of breath.    . etonogestrel (NEXPLANON) 68 MG IMPL implant 1 each once by Subdermal route.    . hydrOXYzine (ATARAX/VISTARIL) 25 MG tablet Take 25 mg every 8 (eight) hours as needed by mouth for itching.    Marland Kitchen omeprazole (PRILOSEC) 20 MG capsule Take 20 mg daily by mouth.  4  . ondansetron (ZOFRAN-ODT) 4 MG disintegrating tablet Take 4 mg every 8 (eight) hours as needed by mouth for nausea or vomiting.    . predniSONE (DELTASONE) 10 MG tablet Take 10-50 mg See admin instructions by mouth. Starting on 09-02-2017 take five tablets on day 1, then four on day 2, then three on day 3, then two on day 4, then one on day 5, then stop    . ranitidine (ZANTAC) 300 MG tablet Take 300 mg at bedtime by mouth.    Marland Kitchen  ibuprofen (ADVIL,MOTRIN) 600 MG tablet Take 1 tablet (600 mg total) by mouth every 6 (six) hours. (Patient not taking: Reported on 05/13/2017) 30 tablet 0     (Not in a hospital admission)  Results for orders placed or performed during the hospital encounter of 09/03/17 (from the past 48 hour(s))  Urinalysis, Routine w reflex microscopic     Status: Abnormal   Collection Time: 09/03/17  9:01 PM  Result Value Ref Range   Color, Urine AMBER (A) YELLOW    Comment: BIOCHEMICALS MAY BE AFFECTED BY COLOR   APPearance CLEAR CLEAR   Specific Gravity,  Urine 1.011 1.005 - 1.030   pH 7.0 5.0 - 8.0   Glucose, UA NEGATIVE NEGATIVE mg/dL   Hgb urine dipstick NEGATIVE NEGATIVE   Bilirubin Urine NEGATIVE NEGATIVE   Ketones, ur NEGATIVE NEGATIVE mg/dL   Protein, ur NEGATIVE NEGATIVE mg/dL   Nitrite NEGATIVE NEGATIVE   Leukocytes, UA TRACE (A) NEGATIVE   RBC / HPF 0-5 0 - 5 RBC/hpf   WBC, UA 0-5 0 - 5 WBC/hpf   Bacteria, UA RARE (A) NONE SEEN   Squamous Epithelial / LPF 0-5 (A) NONE SEEN  Pregnancy, urine     Status: None   Collection Time: 09/03/17  9:01 PM  Result Value Ref Range   Preg Test, Ur NEGATIVE NEGATIVE    Comment:        THE SENSITIVITY OF THIS METHODOLOGY IS >20 mIU/mL.   CBC with Differential     Status: Abnormal   Collection Time: 09/03/17 10:36 PM  Result Value Ref Range   WBC 9.2 4.0 - 10.5 K/uL   RBC 4.74 3.87 - 5.11 MIL/uL   Hemoglobin 14.4 12.0 - 15.0 g/dL   HCT 41.8 36.0 - 46.0 %   MCV 88.2 78.0 - 100.0 fL   MCH 30.4 26.0 - 34.0 pg   MCHC 34.4 30.0 - 36.0 g/dL   RDW 12.9 11.5 - 15.5 %   Platelets 315 150 - 400 K/uL   Neutrophils Relative % 87 %   Neutro Abs 8.1 (H) 1.7 - 7.7 K/uL   Lymphocytes Relative 10 %   Lymphs Abs 0.9 0.7 - 4.0 K/uL   Monocytes Relative 3 %   Monocytes Absolute 0.3 0.1 - 1.0 K/uL   Eosinophils Relative 0 %   Eosinophils Absolute 0.0 0.0 - 0.7 K/uL   Basophils Relative 0 %   Basophils Absolute 0.0 0.0 - 0.1 K/uL  Lipase, blood     Status: None   Collection Time: 09/03/17 10:36 PM  Result Value Ref Range   Lipase 45 11 - 51 U/L  Comprehensive metabolic panel     Status: Abnormal   Collection Time: 09/03/17 10:37 PM  Result Value Ref Range   Sodium 139 135 - 145 mmol/L   Potassium 4.2 3.5 - 5.1 mmol/L   Chloride 106 101 - 111 mmol/L   CO2 25 22 - 32 mmol/L   Glucose, Bld 148 (H) 65 - 99 mg/dL   BUN 11 6 - 20 mg/dL   Creatinine, Ser 0.45 0.44 - 1.00 mg/dL   Calcium 9.6 8.9 - 10.3 mg/dL   Total Protein 8.3 (H) 6.5 - 8.1 g/dL   Albumin 4.4 3.5 - 5.0 g/dL   AST 614 (H) 15 -  41 U/L   ALT 1,638 (H) 14 - 54 U/L   Alkaline Phosphatase 235 (H) 38 - 126 U/L   Total Bilirubin 6.0 (H) 0.3 - 1.2 mg/dL   GFR calc non  Af Amer >60 >60 mL/min   GFR calc Af Amer >60 >60 mL/min    Comment: (NOTE) The eGFR has been calculated using the CKD EPI equation. This calculation has not been validated in all clinical situations. eGFR's persistently <60 mL/min signify possible Chronic Kidney Disease.    Anion gap 8 5 - 15   Dg Chest 2 View  Result Date: 09/03/2017 CLINICAL DATA:  26 y/o  F; abdominal pain, nausea, vomiting. EXAM: CHEST  2 VIEW COMPARISON:  None. FINDINGS: The heart size and mediastinal contours are within normal limits. Both lungs are clear. The visualized skeletal structures are unremarkable. IMPRESSION: No active cardiopulmonary disease. Electronically Signed   By: Kristine Garbe M.D.   On: 09/03/2017 23:44   Ct Abdomen Pelvis W Contrast  Result Date: 09/04/2017 CLINICAL DATA:  26 y/o F; sharp intermittent right upper abdominal pain. EXAM: CT ABDOMEN AND PELVIS WITH CONTRAST TECHNIQUE: Multidetector CT imaging of the abdomen and pelvis was performed using the standard protocol following bolus administration of intravenous contrast. CONTRAST:  138m ISOVUE-300 IOPAMIDOL (ISOVUE-300) INJECTION 61% COMPARISON:  None. FINDINGS: Lower chest: No acute abnormality. Hepatobiliary: No focal liver lesion. Cholelithiasis. Dilated common bile duct to 11 mm and mild intrahepatic biliary ductal dilatation, no obstructing stone or mass identified. No pancreatic ductal dilatation. Pancreas: Unremarkable. No pancreatic ductal dilatation or surrounding inflammatory changes. Spleen: Normal in size without focal abnormality. Adrenals/Urinary Tract: Adrenal glands are unremarkable. Kidneys are normal, without renal calculi, focal lesion, or hydronephrosis. Bladder is unremarkable. Stomach/Bowel: Stomach is within normal limits. Appendix appears normal. No evidence of bowel wall  thickening, distention, or inflammatory changes. Vascular/Lymphatic: No significant vascular findings are present. No enlarged abdominal or pelvic lymph nodes. Reproductive: Uterus and bilateral adnexa are unremarkable. Other: No abdominal wall hernia or abnormality. No abdominopelvic ascites. Musculoskeletal: No acute or significant osseous findings. IMPRESSION: 1. Dilated common bile duct to 11 mm and mild intrahepatic biliary ductal dilatation. No obstructing stone or mass identified. When the patient is clinically stable, able to follow directions, and hold their breath (preferably as an outpatient) further evaluation with dedicated abdominal MRI/MRCP should be considered. 2. Cholelithiasis. 3. Otherwise unremarkable CT of abdomen and pelvis. Electronically Signed   By: LKristine GarbeM.D.   On: 09/04/2017 00:22    Review of Systems  Constitutional: Positive for chills and malaise/fatigue. Negative for diaphoresis and fever.  HENT: Negative for congestion, ear discharge, ear pain, nosebleeds and sore throat.   Eyes: Negative for pain and discharge.  Respiratory: Negative for cough, shortness of breath, wheezing and stridor.   Cardiovascular: Negative for chest pain and palpitations.  Gastrointestinal: Positive for abdominal pain, nausea and vomiting. Negative for blood in stool, constipation and diarrhea.  Genitourinary: Negative for dysuria and hematuria.  Musculoskeletal: Negative for joint pain and myalgias.  Skin: Positive for itching and rash.  Neurological: Positive for dizziness. Negative for sensory change, focal weakness and headaches.  Endo/Heme/Allergies: Negative for polydipsia. Does not bruise/bleed easily.  Psychiatric/Behavioral: Negative for depression. The patient is not nervous/anxious.     Blood pressure 125/75, pulse 76, temperature 98.3 F (36.8 C), temperature source Oral, resp. rate 17, height _0  (1.549 m), weight 88.5 kg (195 lb), last menstrual period  08/13/2017, SpO2 100 %, currently breastfeeding. Physical Exam   Physical Exam: GENERAL: Ill-appearing, well nourished, well-developed.  HENT: Normocephalic, atraumatic; pupils equal and round. Nares patent, without discharge or bleeding. No oropharyngeal lesions or erythema. Mucous membranes are dry.  EYES: Pupils equal, round, and reactive  to light. Scleral icterus noted. No conjunctival erythema. NECK: is supple, no masses, trachea midline.  RESPIRATORY: Clear to auscultation bilaterally. Chest wall movements are symmetric. No use of accessory muscles to breathe.  No wheezing, rales, rhonchi. CARDIOVASCULAR: Normal S1, S2. No rubs, or gallops. PMI non-displaced. Carotids: no carotid bruits. No bradycardia or tachycardia. DP pulses 2+ bilaterally.  GI: soft, non-distended, normal active bowel sounds. No hepatosplenomegaly. Mild RUQ tenderness. INTEGUMENT: Clean, dry, and intact. No rashes. Jaundice noted.  MUSCULOSKELETAL: Moving all extremities. No cyanosis. No clubbing. Edema: none bilaterally.  NEUROLOGICAL: Cranial nerves 2-12 grossly intact. Reflexes: 2+ bilaterally. Babinski: toes downgoing bilaterally.  Motor 5/5 throughout. Sensory grossly intact to light touch. Intact rapid alternating movements bilaterally. No pronator drift.  PSYCHIATRIC: Fully oriented. Normal and appropriate affect.  LYMPHATIC: No cervical lymphadenopathy. No supraclavicular lymphadenopathy.  Assessment/Plan  Cholelithiasis with choledocholithiasis Likely cause of her RUQ pain and elevated LFTs. Plan: IV dilaudid prn. Cultures ordered. IV antibiotics empirically. GI consult. General surgery consult.   Elevated LFTs Severely elevated LFTs: on admission, AST 614, ALT 1638, T bili 6, AP 235. Suspect is due to acute choledocholithiasis. Plan: Recheck in AM. Check Direct bili, ammonia level, hepatitis panel. GI consult.   Jaundice T bili on admission is 6. No history of jaundice or elevated  LFTs. Plan: CMP, direct bili in AM. Hepatitis panel. GI consult.   Acute cholecystitis Possible diagnosis. Plan: GI and general surgery consults.   Nausea and vomiting in adult PRN Zofran.  Right upper quadrant abdominal pain Plan: IV dilaudid PRN. GI and general surgery consults.   GERD (gastroesophageal reflux disease) Recently diagnosed, but diagnosis is now in question given patient's LFTs. Plan: Will continue home meds for now.     Tacey Ruiz, MD 09/04/2017, 1:58 AM

## 2017-09-05 ENCOUNTER — Encounter (HOSPITAL_COMMUNITY): Payer: Self-pay | Admitting: *Deleted

## 2017-09-05 ENCOUNTER — Encounter (HOSPITAL_COMMUNITY)
Admission: EM | Disposition: A | Discharge status: Home / Self Care | Payer: Self-pay | Source: Home / Self Care | Attending: Internal Medicine

## 2017-09-05 ENCOUNTER — Inpatient Hospital Stay (HOSPITAL_COMMUNITY): Payer: 59 | Admitting: Anesthesiology

## 2017-09-05 DIAGNOSIS — K831 Obstruction of bile duct: Secondary | ICD-10-CM

## 2017-09-05 DIAGNOSIS — K8070 Calculus of gallbladder and bile duct without cholecystitis without obstruction: Secondary | ICD-10-CM

## 2017-09-05 DIAGNOSIS — Z9889 Other specified postprocedural states: Secondary | ICD-10-CM

## 2017-09-05 DIAGNOSIS — R945 Abnormal results of liver function studies: Secondary | ICD-10-CM

## 2017-09-05 HISTORY — PX: CHOLECYSTECTOMY: SHX55

## 2017-09-05 LAB — COMPREHENSIVE METABOLIC PANEL
ALT: 833 U/L — AB (ref 14–54)
AST: 104 U/L — AB (ref 15–41)
Albumin: 3.8 g/dL (ref 3.5–5.0)
Alkaline Phosphatase: 176 U/L — ABNORMAL HIGH (ref 38–126)
Anion gap: 9 (ref 5–15)
BUN: 14 mg/dL (ref 6–20)
CHLORIDE: 104 mmol/L (ref 101–111)
CO2: 26 mmol/L (ref 22–32)
CREATININE: 0.71 mg/dL (ref 0.44–1.00)
Calcium: 8.7 mg/dL — ABNORMAL LOW (ref 8.9–10.3)
GFR calc Af Amer: >60 mL/min (ref >60)
Glucose, Bld: 91 mg/dL (ref 65–99)
Potassium: 3.6 mmol/L (ref 3.5–5.1)
Sodium: 139 mmol/L (ref 135–145)
Total Bilirubin: 1 mg/dL (ref 0.3–1.2)
Total Protein: 7 g/dL (ref 6.5–8.1)

## 2017-09-05 LAB — HEPATITIS PANEL, ACUTE
HCV Ab: <0.1 s/co ratio (ref 0.0–0.9)
Hep A IgM: NEGATIVE
Hep B C IgM: NEGATIVE
Hepatitis B Surface Ag: NEGATIVE

## 2017-09-05 LAB — HEMOGLOBIN A1C
Hgb A1c MFr Bld: 5 % (ref 4.8–5.6)
MEAN PLASMA GLUCOSE: 96.8 mg/dL

## 2017-09-05 LAB — CBC
HCT: 39.8 % (ref 36.0–46.0)
Hemoglobin: 13.3 g/dL (ref 12.0–15.0)
MCH: 30.2 pg (ref 26.0–34.0)
MCHC: 33.4 g/dL (ref 30.0–36.0)
MCV: 90.5 fL (ref 78.0–100.0)
PLATELETS: 285 10*3/uL (ref 150–400)
RBC: 4.4 MIL/uL (ref 3.87–5.11)
RDW: 13.2 % (ref 11.5–15.5)
WBC: 9.7 10*3/uL (ref 4.0–10.5)

## 2017-09-05 SURGERY — LAPAROSCOPIC CHOLECYSTECTOMY
Anesthesia: General

## 2017-09-05 MED ORDER — MIDAZOLAM HCL 2 MG/2ML IJ SOLN
1.0000 mg | INTRAMUSCULAR | Status: DC
Start: 2017-09-05 — End: 2017-09-05
  Administered 2017-09-05: 2 mg via INTRAVENOUS

## 2017-09-05 MED ORDER — IBUPROFEN 600 MG PO TABS: 600.0000 mg | ORAL_TABLET | Freq: Four times a day (QID) | ORAL | Status: DC | PRN

## 2017-09-05 MED ORDER — NEOSTIGMINE METHYLSULFATE 10 MG/10ML IV SOLN
INTRAVENOUS | Status: AC
  Filled 2017-09-05: qty 1

## 2017-09-05 MED ORDER — ROCURONIUM BROMIDE 100 MG/10ML IV SOLN
INTRAVENOUS | Status: DC | PRN
  Administered 2017-09-05: 5 mg via INTRAVENOUS
  Administered 2017-09-05: 35 mg via INTRAVENOUS

## 2017-09-05 MED ORDER — FENTANYL CITRATE (PF) 100 MCG/2ML IJ SOLN
25.0000 ug | INTRAMUSCULAR | Status: DC | PRN
  Administered 2017-09-05: 25 ug via INTRAVENOUS

## 2017-09-05 MED ORDER — PROPOFOL 10 MG/ML IV BOLUS
INTRAVENOUS | Status: AC
  Filled 2017-09-05: qty 20

## 2017-09-05 MED ORDER — NEOSTIGMINE METHYLSULFATE 10 MG/10ML IV SOLN
INTRAVENOUS | Status: DC | PRN
  Administered 2017-09-05 (×2): 2 mg via INTRAVENOUS

## 2017-09-05 MED ORDER — HEMOSTATIC AGENTS (NO CHARGE) OPTIME
TOPICAL | Status: DC | PRN
  Administered 2017-09-05: 1 via TOPICAL

## 2017-09-05 MED ORDER — GLYCOPYRROLATE 0.2 MG/ML IJ SOLN
INTRAMUSCULAR | Status: AC
  Filled 2017-09-05: qty 3

## 2017-09-05 MED ORDER — LIDOCAINE HCL 1 % IJ SOLN
INTRAMUSCULAR | Status: DC | PRN
  Administered 2017-09-05: 25 mg via INTRADERMAL

## 2017-09-05 MED ORDER — MIDAZOLAM HCL 2 MG/2ML IJ SOLN
INTRAMUSCULAR | Status: AC
  Filled 2017-09-05: qty 2

## 2017-09-05 MED ORDER — OXYCODONE HCL 5 MG PO TABS
5.0000 mg | ORAL_TABLET | ORAL | Status: DC | PRN
  Administered 2017-09-05: 5 mg via ORAL
  Administered 2017-09-06: 10 mg via ORAL
  Filled 2017-09-05: qty 1
  Filled 2017-09-05: qty 2

## 2017-09-05 MED ORDER — DEXAMETHASONE SODIUM PHOSPHATE 4 MG/ML IJ SOLN
INTRAMUSCULAR | Status: AC
  Filled 2017-09-05: qty 1

## 2017-09-05 MED ORDER — DEXAMETHASONE SODIUM PHOSPHATE 4 MG/ML IJ SOLN
4.0000 mg | Freq: Once | INTRAMUSCULAR | Status: AC
  Administered 2017-09-05: 4 mg via INTRAVENOUS

## 2017-09-05 MED ORDER — BUPIVACAINE HCL (PF) 0.5 % IJ SOLN
INTRAMUSCULAR | Status: AC
  Filled 2017-09-05: qty 30

## 2017-09-05 MED ORDER — ONDANSETRON HCL 4 MG/2ML IJ SOLN
INTRAMUSCULAR | Status: AC
  Filled 2017-09-05: qty 2

## 2017-09-05 MED ORDER — FENTANYL CITRATE (PF) 100 MCG/2ML IJ SOLN
25.0000 ug | INTRAMUSCULAR | Status: DC | PRN
  Administered 2017-09-05 (×2): 50 ug via INTRAVENOUS

## 2017-09-05 MED ORDER — FENTANYL CITRATE (PF) 250 MCG/5ML IJ SOLN
INTRAMUSCULAR | Status: AC
  Filled 2017-09-05: qty 5

## 2017-09-05 MED ORDER — KETOROLAC TROMETHAMINE 30 MG/ML IJ SOLN
30.0000 mg | Freq: Once | INTRAMUSCULAR | Status: AC
  Administered 2017-09-05: 30 mg via INTRAVENOUS

## 2017-09-05 MED ORDER — PROPOFOL 10 MG/ML IV BOLUS
INTRAVENOUS | Status: DC | PRN
  Administered 2017-09-05: 150 mg via INTRAVENOUS

## 2017-09-05 MED ORDER — FENTANYL CITRATE (PF) 100 MCG/2ML IJ SOLN
INTRAMUSCULAR | Status: AC
  Filled 2017-09-05: qty 2

## 2017-09-05 MED ORDER — FENTANYL CITRATE (PF) 100 MCG/2ML IJ SOLN
INTRAMUSCULAR | Status: DC | PRN
  Administered 2017-09-05 (×5): 50 ug via INTRAVENOUS

## 2017-09-05 MED ORDER — ONDANSETRON HCL 4 MG/2ML IJ SOLN
4.0000 mg | Freq: Once | INTRAMUSCULAR | Status: AC
  Administered 2017-09-05: 4 mg via INTRAVENOUS

## 2017-09-05 MED ORDER — LACTATED RINGERS IV SOLN
INTRAVENOUS | Status: DC
  Administered 2017-09-05: 16:00:00 via INTRAVENOUS

## 2017-09-05 MED ORDER — ACETAMINOPHEN 500 MG PO TABS: 1000.0000 mg | ORAL_TABLET | Freq: Four times a day (QID) | ORAL | Status: DC | PRN

## 2017-09-05 MED ORDER — SODIUM CHLORIDE 0.9 % IR SOLN
Status: DC | PRN
  Administered 2017-09-05: 1000 mL

## 2017-09-05 MED ORDER — CEFOTETAN DISODIUM-DEXTROSE 2-2.08 GM-%(50ML) IV SOLR
INTRAVENOUS | Status: AC
  Filled 2017-09-05: qty 50

## 2017-09-05 MED ORDER — CEFOTETAN DISODIUM-DEXTROSE 2-2.08 GM-%(50ML) IV SOLR
2.0000 g | INTRAVENOUS | Status: AC
  Administered 2017-09-05: 2 g via INTRAVENOUS

## 2017-09-05 MED ORDER — KETOROLAC TROMETHAMINE 30 MG/ML IJ SOLN
INTRAMUSCULAR | Status: AC
  Filled 2017-09-05: qty 1

## 2017-09-05 MED ORDER — GLYCOPYRROLATE 0.2 MG/ML IJ SOLN
INTRAMUSCULAR | Status: DC | PRN
  Administered 2017-09-05: 0.6 mg via INTRAVENOUS

## 2017-09-05 MED ORDER — BUPIVACAINE HCL (PF) 0.5 % IJ SOLN
INTRAMUSCULAR | Status: DC | PRN
  Administered 2017-09-05: 10 mL

## 2017-09-05 SURGICAL SUPPLY — 45 items
APPLIER CLIP ROT 10 11.4 M/L (STAPLE) ×2
BAG HAMPER (MISCELLANEOUS) ×2 IMPLANT
BAG RETRIEVAL 10 (BASKET) ×1
BLADE SURG 15 STRL LF DISP TIS (BLADE) ×1 IMPLANT
BLADE SURG 15 STRL SS (BLADE) ×1
CHLORAPREP W/TINT 26ML (MISCELLANEOUS) ×2 IMPLANT
CLIP APPLIE ROT 10 11.4 M/L (STAPLE) ×1 IMPLANT
CLOTH BEACON ORANGE TIMEOUT ST (SAFETY) ×2 IMPLANT
COVER LIGHT HANDLE STERIS (MISCELLANEOUS) ×4 IMPLANT
DECANTER SPIKE VIAL GLASS SM (MISCELLANEOUS) ×2 IMPLANT
DERMABOND ADVANCED (GAUZE/BANDAGES/DRESSINGS) ×1
DERMABOND ADVANCED .7 DNX12 (GAUZE/BANDAGES/DRESSINGS) ×1 IMPLANT
ELECT REM PT RETURN 9FT ADLT (ELECTROSURGICAL) ×2
ELECTRODE REM PT RTRN 9FT ADLT (ELECTROSURGICAL) ×1 IMPLANT
FILTER SMOKE EVAC LAPAROSHD (FILTER) ×2 IMPLANT
FORMALIN 10 PREFIL 120ML (MISCELLANEOUS) ×2 IMPLANT
GLOVE BIO SURGEON STRL SZ 6.5 (GLOVE) ×4 IMPLANT
GLOVE BIOGEL PI IND STRL 6.5 (GLOVE) ×2 IMPLANT
GLOVE BIOGEL PI IND STRL 7.0 (GLOVE) ×2 IMPLANT
GLOVE BIOGEL PI INDICATOR 6.5 (GLOVE) ×2
GLOVE BIOGEL PI INDICATOR 7.0 (GLOVE) ×2
GLOVE SURG SS PI 7.5 STRL IVOR (GLOVE) ×2 IMPLANT
GOWN STRL REUS W/TWL LRG LVL3 (GOWN DISPOSABLE) ×6 IMPLANT
HEMOSTAT SNOW SURGICEL 2X4 (HEMOSTASIS) ×2 IMPLANT
INST SET LAPROSCOPIC AP (KITS) ×2 IMPLANT
IV NS IRRIG 3000ML ARTHROMATIC (IV SOLUTION) IMPLANT
KIT ROOM TURNOVER APOR (KITS) ×2 IMPLANT
MANIFOLD NEPTUNE II (INSTRUMENTS) ×2 IMPLANT
NEEDLE INSUFFLATION 14GA 120MM (NEEDLE) ×2 IMPLANT
NS IRRIG 1000ML POUR BTL (IV SOLUTION) ×2 IMPLANT
PACK LAP CHOLE LZT030E (CUSTOM PROCEDURE TRAY) ×2 IMPLANT
PAD ARMBOARD 7.5X6 YLW CONV (MISCELLANEOUS) ×2 IMPLANT
SET BASIN LINEN APH (SET/KITS/TRAYS/PACK) ×2 IMPLANT
SET TUBE IRRIG SUCTION NO TIP (IRRIGATION / IRRIGATOR) IMPLANT
SLEEVE ENDOPATH XCEL 5M (ENDOMECHANICALS) ×2 IMPLANT
SUT MNCRL AB 4-0 PS2 18 (SUTURE) ×4 IMPLANT
SUT VICRYL 0 UR6 27IN ABS (SUTURE) ×2 IMPLANT
SYS BAG RETRIEVAL 10MM (BASKET) ×1
SYSTEM BAG RETRIEVAL 10MM (BASKET) ×1 IMPLANT
TROCAR XCEL NON-BLD 11X100MML (ENDOMECHANICALS) ×2 IMPLANT
TROCAR XCEL NON-BLD 5MMX100MML (ENDOMECHANICALS) ×2 IMPLANT
TROCAR Z-THREAD SLEEVE 11X100 (TROCAR) ×2 IMPLANT
TUBE CONNECTING 12X1/4 (SUCTIONS) ×2 IMPLANT
TUBING INSUFFLATION (TUBING) ×2 IMPLANT
WARMER LAPAROSCOPE (MISCELLANEOUS) ×2 IMPLANT

## 2017-09-05 NOTE — Op Note (Signed)
Operative Note   Preoperative Diagnosis: Choledocholithiasis; cholelithiasis     Postoperative Diagnosis: Same   Procedure(s) Performed: Laparoscopic cholecystectomy   Surgeon: Lillia AbedLindsay C. Henreitta LeberBridges, MD   Assistants: Franky MachoMark Jenkins, MD    Anesthesia: General endotracheal   Anesthesiologist: Laurene FootmanGonzalez, Luis, MD    Specimens: Gallbladder    Estimated Blood Loss: Minimal    Blood Replacement: None    Complications: None   Wound class: Clean Contaminated    Operative Findings: Normal gallbladder without inflammation, dilated cystic duct, about 10 mm in size    Procedure: The patient was taken to the operating room and placed supine. General endotracheal anesthesia was induced. Intravenous antibiotics were administered per protocol. An orogastric tube positioned to decompress the stomach. The abdomen was prepared and draped in the usual sterile fashion.    A left upper quadrant stab incision was made and a Veress technique was utilized to achieve pneumoperitoneum to 15 mmHg with carbon dioxide. A 12 mm optiview port was placed through the supraumbilical region, and a 10 mm 0-degree operative laparoscope was introduced. The area underlying the trocar and Veress needle were inspected and without evidence of injury.  Remaining trocars were placed under direct vision. Two 5 mm ports were placed in the right abdomen, between the anterior axillary and midclavicular line.  A 10 mm port was placed through the mid-epigastrium, near the falciform ligament.    The gallbladder fundus was elevated cephalad and the infundibulum was retracted to the patient's right. The peritoneal attachments to the side walls of the gallbladder and infundibulum were released, freeing up the gallbladder from the liver bed.  The gallbladder/cystic duct junction was skeletonized, and the cystic duct was noted to be enlarged about a little less than 10mm.  The cystic artery noted to be running over the cystic duct, but was easily  dissected out.  Given the proximity to the duct, the artery was clipped to prevent any bleeding. It was double clipped and divided.  The duct was then further skeletonized and was found to enter to gallbladder and the hepatic bed was behind the duct. The duct was large at about 10mm, but was clearly the cystic duct. The critical view was achieved.    The cystic duct was triply clipped and divided using locking clips. The gallbladder was then dissected from the liver bed with electrocautery. The hepatic bed was hemostatic and Surgical Jamelle HaringSnow was placed in the field. The specimen was placed in an Endopouch and was retrieved through the epigastric site.   Final inspection revealed acceptable hemostasis. The epigastric site was closed with a 0 Vicryl suture,and the umbilical port site was too small for closure. Trocars were removed and pneumoperitoneum was released. Skin incisions were closed with 4-0 Monocryl subcuticular sutures and Dermabond. The patient was awakened from anesthesia and extubated without complication. All counts were correct at the end of the case.    Algis GreenhouseLindsay Bridges, MD [facility] [address] [place], [place] [id number] [phone] (office)

## 2017-09-05 NOTE — Progress Notes (Signed)
  Subjective:  Patient feels much better.  She denies nausea or vomiting mild pain in right upper quadrant.  Objective: Blood pressure 126/82, pulse 65, temperature 99.3 F (37.4 C), temperature source Oral, resp. rate 16, height 5\' 2"  (1.575 m), weight 193 lb 9 oz (87.8 kg), last menstrual period 08/13/2017, SpO2 98 %, currently breastfeeding. Patient does not appear to be jaundiced.  Labs/studies Results:  Recent Labs    09/03/17 2236 09/05/17 0603  WBC 9.2 9.7  HGB 14.4 13.3  HCT 41.8 39.8  PLT 315 285    BMET  Recent Labs    09/03/17 2237 09/04/17 0435 09/05/17 0603  NA 139 138 139  K 4.2 3.4* 3.6  CL 106 103 104  CO2 25 26 26   GLUCOSE 148* 113* 91  BUN 11 10 14   CREATININE 0.45 0.57 0.71  CALCIUM 9.6 9.4 8.7*    LFT  Recent Labs    09/03/17 2237 09/04/17 0435 09/05/17 0603  PROT 8.3* 8.2* 7.0  ALBUMIN 4.4 4.3 3.8  AST 614* 464* 104*  ALT 1,638* 1,508* 833*  ALKPHOS 235* 234* 176*  BILITOT 6.0* 3.9* 1.0  BILIDIR  --  2.3*  --     PT/INR  Recent Labs    09/04/17 0435  LABPROT 14.0  INR 1.09    Hepatitis Panel  Recent Labs    09/04/17 0435  HEPBSAG Negative  HCVAB <0.1  HEPAIGM Negative  HEPBIGM Negative     Assessment:  #1.  Obstructive jaundice secondary to choledocholithiasis.  Status post ERCP with enterotomy and stone extraction yesterday.  Bili is now normal and transaminases significantly decreased.  #2.  Cholelithiasis.  Patient for surgery today.  Recommendations:  Will check LFTs in a.m.

## 2017-09-05 NOTE — Transfer of Care (Signed)
Immediate Anesthesia Transfer of Care Note  Patient: Kelly Mahoney  Procedure(s) Performed: LAPAROSCOPIC CHOLECYSTECTOMY (N/A )  Patient Location: PACU  Anesthesia Type:General  Level of Consciousness: awake and patient cooperative  Airway & Oxygen Therapy: Patient Spontanous Breathing and Patient connected to face mask oxygen  Post-op Assessment: Report given to RN, Post -op Vital signs reviewed and stable and Patient moving all extremities  Post vital signs: Reviewed and stable  Last Vitals:  Vitals:   09/05/17 0547 09/05/17 1421  BP: 114/60 126/82  Pulse: (!) 110 65  Resp: 16 16  Temp: 36.8 C 37.4 C  SpO2: 100% 98%    Last Pain:  Vitals:   09/05/17 1421  TempSrc: Oral  PainSc: 0-No pain      Patients Stated Pain Goal: 0 (09/05/17 0915)  Complications: No apparent anesthesia complications

## 2017-09-05 NOTE — Interval H&P Note (Signed)
History and Physical Interval Note:  09/05/2017 10:32 AM  Kelly Mahoney  has presented today for surgery, with the diagnosis of cholelithiasis, choledocolithiasis  The various methods of treatment have been discussed with the patient and family. After consideration of risks, benefits and other options for treatment, the patient has consented to  Procedure(s): LAPAROSCOPIC CHOLECYSTECTOMY (N/A) as a surgical intervention .  The patient's history has been reviewed, patient examined, no change in status, stable for surgery.  I have reviewed the patient's chart and labs.  Questions were answered to the patient's satisfaction.  PLAN: I counseled the patient about the indication, risks and benefits of laparoscopic cholecystectomy.  She understands there is a very small chance for bleeding, infection, injury to normal structures (including common bile duct), conversion to open surgery, persistent symptoms, evolution of postcholecystectomy diarrhea, need for secondary interventions, anesthesia reaction, cardiopulmonary issues and other risks not specifically detailed here. I described the expected recovery, the plan for follow-up and the restrictions during the recovery phase.  All questions were answered.  Patient tolerated ERCP with stone removal and sphincterotomy. T bili down to normal. Doing well this AM.  Will take onto my service after the surgery.   Algis GreenhouseLindsay Declin Rajan, MD [facility] [address] [place], [place] [phone] (office)

## 2017-09-05 NOTE — Anesthesia Procedure Notes (Signed)
Procedure Name: Intubation Date/Time: 09/05/2017 3:50 PM Performed by: Charmaine Downs, CRNA Pre-anesthesia Checklist: Patient identified, Patient being monitored, Timeout performed, Emergency Drugs available and Suction available Patient Re-evaluated:Patient Re-evaluated prior to induction Oxygen Delivery Method: Circle System Utilized Preoxygenation: Pre-oxygenation with 100% oxygen Induction Type: IV induction Ventilation: Mask ventilation without difficulty and Oral airway inserted - appropriate to patient size Laryngoscope Size: Mac and 4 Grade View: Grade I Tube type: Oral Tube size: 7.0 mm Number of attempts: 1 Airway Equipment and Method: stylet Placement Confirmation: ETT inserted through vocal cords under direct vision,  positive ETCO2 and breath sounds checked- equal and bilateral Secured at: 22 cm Tube secured with: Tape Dental Injury: Teeth and Oropharynx as per pre-operative assessment

## 2017-09-05 NOTE — Anesthesia Preprocedure Evaluation (Signed)
Anesthesia Evaluation  Patient identified by MRN, date of birth, ID band Patient awake    Reviewed: Allergy & Precautions, NPO status , Patient's Chart, lab work & pertinent test results  Airway Mallampati: I  TM Distance: >3 FB Neck ROM: Full    Dental no notable dental hx. (+) Teeth Intact   Pulmonary neg pulmonary ROS,    Pulmonary exam normal breath sounds clear to auscultation       Cardiovascular negative cardio ROS Normal cardiovascular exam Rhythm:Regular Rate:Normal     Neuro/Psych negative neurological ROS  negative psych ROS   GI/Hepatic GERD  Medicated and Controlled,Some nausea this morning   Endo/Other  negative endocrine ROS  Renal/GU negative Renal ROS     Musculoskeletal   Abdominal Normal abdominal exam  (+)   Peds  Hematology   Anesthesia Other Findings   Reproductive/Obstetrics                             Anesthesia Physical Anesthesia Plan  ASA: II  Anesthesia Plan: General   Post-op Pain Management:    Induction: Intravenous  PONV Risk Score and Plan:   Airway Management Planned: Oral ETT  Additional Equipment:   Intra-op Plan:   Post-operative Plan: Extubation in OR  Informed Consent: I have reviewed the patients History and Physical, chart, labs and discussed the procedure including the risks, benefits and alternatives for the proposed anesthesia with the patient or authorized representative who has indicated his/her understanding and acceptance.     Plan Discussed with:   Anesthesia Plan Comments:         Anesthesia Quick Evaluation

## 2017-09-05 NOTE — Discharge Instructions (Signed)
Discharge Instructions: Shower per your regular routine. Take tylenol and ibuprofen as needed for pain control, alternating every 4-6 hours.  Take Roxicodone for breakthrough pain. Take colace for constipation related to narcotic pain medication. Do not pick at the dermabond glue on your incision sites.  If you ride in a car, take breaks every hour and walk around for at least 10 minutes a stop.     Colecistectoma laparoscpica, cuidados posteriores (Laparoscopic Cholecystectomy, Care After) Estas indicaciones le proporcionan informacin acerca de cmo deber cuidarse despus del procedimiento. El mdico tambin podr darle instrucciones especficas. Comunquese con el mdico si tiene algn problema o tiene preguntas despus del procedimiento. CUIDADOS EN EL HOGAR Cuidado de la incisin  Siga las indicaciones del mdico en lo que respecta al cuidado de los cortes de la ciruga (incisiones). Haga lo siguiente: ? Lvese las manos con agua y jabn antes de Multimedia programmercambiar las vendas (vendaje). Use un desinfectante para manos si no dispone de [place] y [place]. ? Cambie el vendaje como se lo haya indicado el mdico. ? No retire los puntos (suturas), el QUALCOMMadhesivo para la piel o las tiras [place] Cityadhesivas. Tal vez deban dejarse puestos en la piel durante 2semanas o ms tiempo. Si las tiras [place]adhesivas se despegan y se enroscan, puede recortar los bordes sueltos. No retire las tiras Agilent Technologiesadhesivas por completo a menos que el mdico lo autorice.  No tome baos de inmersin, no practique natacin ni use el jacuzzi hasta que el mdico lo autorice. Pregntele al mdico si puede ducharse. Delle Reiningal vez solo le permitan darse baos de [place]esponja. Instrucciones generales  Baxter Internationalome los medicamentos de venta libre y los recetados solamente como se lo haya indicado el mdico.  No conduzca ni use maquinaria pesada mientras toma analgsicos recetados.  Reanude la dieta normal como se lo haya indicado el mdico.  No levante ningn objeto que  pese ms de 10libras (4,5kg).  No practique deportes de contacto durante 1semana o hasta que el mdico lo autorice. SOLICITE AYUDA SI:  Tiene enrojecimiento, hinchazn o Art therapistdolor en el lugar de los cortes quirrgicos.  Tiene secrecin de lquido, sangre o pus que Micron Technologyemana de los cortes.  Percibe que sale mal olor de la zona de las incisiones.  Los cortes quirrgicos se abren.  Tiene fiebre.  SOLICITE AYUDA DE INMEDIATO SI:  Tiene una erupcin cutnea.  Tiene dificultad para respirar.  Siente dolor en el pecho.  Siente que Chief Technology Officerel dolor en los hombros (en la zona donde van los breteles) Harrington Challengerempeora.  Se desvanece (se desmaya) o se marea mientras est de pie.  Tiene dolor muy intenso de vientre (abdomen).  Tiene malestar estomacal (nuseas) o vomita durante ms de 1da.  Esta informacin no tiene Theme park managercomo fin reemplazar el consejo del mdico. Asegrese de hacerle al mdico cualquier pregunta que tenga. Document Released: 06/23/2011 Document Revised: 07/02/2015 Document Reviewed: 03/29/2016 Elsevier Interactive Patient Education  2017 Elsevier Inc. Laparoscopic Cholecystectomy, Care After This sheet gives you information about how to care for yourself after your procedure. Your doctor may also give you more specific instructions. If you have problems or questions, contact your doctor. Follow these instructions at home: Care for cuts from surgery (incisions)   Follow instructions from your doctor about how to take care of your cuts from surgery. Make sure you: ? Wash your hands with soap and water before you change your bandage (dressing). If you cannot use soap and water, use hand sanitizer. ? Change your bandage as told by your doctor. ? Leave stitches (  sutures), skin glue, or skin tape (adhesive) strips in place. They may need to stay in place for 2 weeks or longer. If tape strips get loose and curl up, you may trim the loose edges. Do not remove tape strips completely unless your doctor  says it is okay.  Do not take baths, swim, or use a hot tub until your doctor says it is okay. Ask your doctor if you can take showers. You may only be allowed to take sponge baths for bathing.  Check your surgical cut area every day for signs of infection. Check for: ? More redness, swelling, or pain. ? More fluid or blood. ? Warmth. ? Pus or a bad smell. Activity  Do not drive or use heavy machinery while taking prescription pain medicine.  Do not lift anything that is heavier than 10 lb (4.5 kg) until your doctor says it is okay.  Do not play contact sports until your doctor says it is okay.  Do not drive for 24 hours if you were given a medicine to help you relax (sedative).  Rest as needed. Do not return to work or school until your doctor says it is okay. General instructions  Take over-the-counter and prescription medicines only as told by your doctor.  To prevent or treat constipation while you are taking prescription pain medicine, your doctor may recommend that you: ? Drink enough fluid to keep your pee (urine) clear or pale yellow. ? Take over-the-counter or prescription medicines. ? Eat foods that are high in fiber, such as fresh fruits and vegetables, whole grains, and beans. ? Limit foods that are high in fat and processed sugars, such as fried and sweet foods. Contact a doctor if:  You develop a rash.  You have more redness, swelling, or pain around your surgical cuts.  You have more fluid or blood coming from your surgical cuts.  Your surgical cuts feel warm to the touch.  You have pus or a bad smell coming from your surgical cuts.  You have a fever.  One or more of your surgical cuts breaks open. Get help right away if:  You have trouble breathing.  You have chest pain.  You have pain that is getting worse in your shoulders.  You faint or feel dizzy when you stand.  You have very bad pain in your belly (abdomen).  You are sick to your stomach  (nauseous) for more than one day.  You have throwing up (vomiting) that lasts for more than one day.  You have leg pain. This information is not intended to replace advice given to you by your health care provider. Make sure you discuss any questions you have with your health care provider. Document Released: 07/20/2008 Document Revised: 05/01/2016 Document Reviewed: 03/29/2016 Elsevier Interactive Patient Education  2017 [employer]

## 2017-09-05 NOTE — Progress Notes (Signed)
[facility] PROGRESS NOTE  Olin PiaMayra Panjwani WUJ:[id number] DOB: 1990-11-07 DOA: 09/03/2017 PCP: Lewis Moccasinewey, Elizabeth R, MD  Brief summary   26 year old female with no past medical history admitted this morning with abdominal pain found to have choledocholithiasis and cholelithiasis, underwent ercp. Planned for cholecystectomy today   Assessment/Plan:  Cholelithiasis with choledocholithiasis. Underwent ercp/choledocholithiasis with stone extraction on 11/11. Planned for laparoscopic cholecystectomy today. LFTs-> improving   Obesity. Recommended lifestyle modification.   Code Status: full Family Communication: d/w patient, family. RN (indicate person spoken with, relationship, and if by phone, the number) Disposition Plan: home 24-48 hrs    Consultants:  Gi  Surgery   Procedures:  ERCP  Antibiotics: Anti-infectives (From admission, onward)   Start     Dose/Rate Route Frequency Ordered Stop   09/04/17 1530  cefTRIAXone (ROCEPHIN) 2 g in dextrose 5 % 50 mL IVPB     2 g 100 mL/hr over 30 Minutes Intravenous Every 24 hours 09/04/17 1456     09/04/17 0045  cefTRIAXone (ROCEPHIN) 2 g in dextrose 5 % 50 mL IVPB     2 g 100 mL/hr over 30 Minutes Intravenous  Once 09/04/17 0035 09/04/17 0125        (indicate start date, and stop date if known)  HPI/Subjective: Alert. Reports feeling better. No vomiting   Objective: Vitals:   09/04/17 2120 09/05/17 0547  BP: 120/69 114/60  Pulse: 63 (!) 110  Resp: 16 16  Temp: 98.6 F (37 C) 98.2 F (36.8 C)  SpO2: 100% 100%    Intake/Output Summary (Last 24 hours) at 09/05/2017 0942 Last data filed at 09/05/2017 0601 Gross per 24 hour  Intake 3192.92 ml  Output 550 ml  Net 2642.92 ml   Filed Weights   09/04/17 0200 09/04/17 0251 09/05/17 0547  Weight: 86.6 kg (191 lb) 86.6 kg (191 lb) 87.8 kg (193 lb 9 oz)    Exam:   General:  No distress   Cardiovascular: s1.s2 rrr  Respiratory: CTA BL   Abdomen: soft, obese,  NT  Musculoskeletal: no leg edema    Data Reviewed: Basic Metabolic Panel: Recent Labs  Lab 09/03/17 2237 09/04/17 0435 09/05/17 0603  NA 139 138 139  K 4.2 3.4* 3.6  CL 106 103 104  CO2 25 26 26   GLUCOSE 148* 113* 91  BUN 11 10 14   CREATININE 0.45 0.57 0.71  CALCIUM 9.6 9.4 8.7*   Liver Function Tests: Recent Labs  Lab 09/03/17 2237 09/04/17 0435 09/05/17 0603  AST 614* 464* 104*  ALT 1,638* 1,508* 833*  ALKPHOS 235* 234* 176*  BILITOT 6.0* 3.9* 1.0  PROT 8.3* 8.2* 7.0  ALBUMIN 4.4 4.3 3.8   Recent Labs  Lab 09/03/17 2236  LIPASE 45   Recent Labs  Lab 09/04/17 0436  AMMONIA 53*   CBC: Recent Labs  Lab 09/03/17 2236 09/05/17 0603  WBC 9.2 9.7  NEUTROABS 8.1*  --   HGB 14.4 13.3  HCT 41.8 39.8  MCV 88.2 90.5  PLT 315 285   Cardiac Enzymes: No results for input(s): CKTOTAL, CKMB, CKMBINDEX, TROPONINI in the last 168 hours. BNP (last 3 results) No results for input(s): BNP in the last 8760 hours.  ProBNP (last 3 results) No results for input(s): PROBNP in the last 8760 hours.  CBG: No results for input(s): GLUCAP in the last 168 hours.  Recent Results (from the past 240 hour(s))  Culture, blood (routine x 2)     Status: None (Preliminary result)   Collection Time: 09/04/17  4:34 AM  Result Value Ref Range Status   Specimen Description BLOOD RIGHT ARM  Final   Special Requests   Final    BOTTLES DRAWN AEROBIC AND ANAEROBIC Blood Culture adequate volume   Culture NO GROWTH 1 DAY  Final   Report Status PENDING  Incomplete  Culture, blood (routine x 2)     Status: None (Preliminary result)   Collection Time: 09/04/17  4:50 AM  Result Value Ref Range Status   Specimen Description BLOOD LEFT HAND  Final   Special Requests   Final    BOTTLES DRAWN AEROBIC ONLY Blood Culture results may not be optimal due to an inadequate volume of blood received in culture bottles   Culture NO GROWTH 1 DAY  Final   Report Status PENDING  Incomplete  Surgical  pcr screen     Status: None   Collection Time: 09/04/17  8:30 PM  Result Value Ref Range Status   MRSA, PCR NEGATIVE NEGATIVE Final   Staphylococcus aureus NEGATIVE NEGATIVE Final    Comment: (NOTE) The Xpert SA Assay (FDA approved for NASAL specimens in patients 32 years of age and older), is one component of a comprehensive surveillance program. It is not intended to diagnose infection nor to guide or monitor treatment.      Studies: Dg Chest 2 View  Result Date: 09/03/2017 CLINICAL DATA:  26 y/o  F; abdominal pain, nausea, vomiting. EXAM: CHEST  2 VIEW COMPARISON:  None. FINDINGS: The heart size and mediastinal contours are within normal limits. Both lungs are clear. The visualized skeletal structures are unremarkable. IMPRESSION: No active cardiopulmonary disease. Electronically Signed   By: Mitzi Hansen M.D.   On: 09/03/2017 23:44   US Abdomen Complete  Result Date: 09/04/2017 CLINICAL DATA:  Elevated LFTs and abdominal pain for the past 3 days. Evaluate for cholecystitis. EXAM: ABDOMEN ULTRASOUND COMPLETE COMPARISON:  CT abdomen pelvis -09/03/2017 FINDINGS: Gallbladder: There are multiple echogenic stones seen throughout the gallbladder. Suspected trace amount of pericholecystic fluid (image 4). No gallbladder wall thickening. Negative sonographic Murphy's sign. Common bile duct: Diameter: Enlarged measuring 1.1 cm in diameter, the etiology of which is not depicted on this examination peer Liver: There is mild increased echogenicity of the hepatic parenchyma. Suspected minimal amount of focal fatty sparing adjacent to the gallbladder fossa. No discrete hepatic lesions. No intrahepatic biliary ductal dilatation. No ascites. Portal vein is patent on color Doppler imaging with normal direction of blood flow towards the liver. IVC: No abnormality visualized. Pancreas: Limited visualization of the pancreatic head and neck is normal. Visualization of the pancreatic body and tail is  obscured by bowel gas. Spleen: Normal in size measuring 7.3 cm in length Right Kidney: Normal cortical thickness, echogenicity and size, measuring 9.9 cm in length. No focal renal lesions. No echogenic renal stones. No urinary obstruction. Left Kidney: Normal cortical thickness, echogenicity and size, measuring 11.7 cm in length. No focal renal lesions. No echogenic renal stones. No urinary obstruction. Abdominal aorta: No aneurysm visualized. Other findings: None. IMPRESSION: 1. Extensive cholelithiasis with indeterminate findings of acute cholecystitis with trace amount of pericholecystic fluid the no gallbladder wall thickening and negative sonographic Murphy's sign. If clinical concern persists for acute cholecystitis, further evaluation with nuclear medicine HIDA scan could be performed as indicated. 2. Dilated common bile duct, the etiology of which is not depicted on this examination. If cholecystectomy with intraoperative cholangiogram is not pursued, further evaluation with MRCP or ERCP could be performed as indicated. 3. Suspected hepatic  steatosis. Electronically Signed   By: Simonne ComeJohn  Watts M.D.   On: 09/04/2017 09:44   Ct Abdomen Pelvis W Contrast  Result Date: 09/04/2017 CLINICAL DATA:  26 y/o F; sharp intermittent right upper abdominal pain. EXAM: CT ABDOMEN AND PELVIS WITH CONTRAST TECHNIQUE: Multidetector CT imaging of the abdomen and pelvis was performed using the standard protocol following bolus administration of intravenous contrast. CONTRAST:  100mL ISOVUE-300 IOPAMIDOL (ISOVUE-300) INJECTION 61% COMPARISON:  None. FINDINGS: Lower chest: No acute abnormality. Hepatobiliary: No focal liver lesion. Cholelithiasis. Dilated common bile duct to 11 mm and mild intrahepatic biliary ductal dilatation, no obstructing stone or mass identified. No pancreatic ductal dilatation. Pancreas: Unremarkable. No pancreatic ductal dilatation or surrounding inflammatory changes. Spleen: Normal in size without focal  abnormality. Adrenals/Urinary Tract: Adrenal glands are unremarkable. Kidneys are normal, without renal calculi, focal lesion, or hydronephrosis. Bladder is unremarkable. Stomach/Bowel: Stomach is within normal limits. Appendix appears normal. No evidence of bowel wall thickening, distention, or inflammatory changes. Vascular/Lymphatic: No significant vascular findings are present. No enlarged abdominal or pelvic lymph nodes. Reproductive: Uterus and bilateral adnexa are unremarkable. Other: No abdominal wall hernia or abnormality. No abdominopelvic ascites. Musculoskeletal: No acute or significant osseous findings. IMPRESSION: 1. Dilated common bile duct to 11 mm and mild intrahepatic biliary ductal dilatation. No obstructing stone or mass identified. When the patient is clinically stable, able to follow directions, and hold their breath (preferably as an outpatient) further evaluation with dedicated abdominal MRI/MRCP should be considered. 2. Cholelithiasis. 3. Otherwise unremarkable CT of abdomen and pelvis. Electronically Signed   By: Mitzi HansenLance  Furusawa-Stratton M.D.   On: 09/04/2017 00:22   Dg Ercp  Result Date: 09/04/2017 CLINICAL DATA:  Common bile duct stone EXAM: ERCP TECHNIQUE: Multiple spot images obtained with the fluoroscopic device and submitted for interpretation post-procedure. FLUOROSCOPY TIME:  16 seconds COMPARISON:  CT abdomen pelvis - 09/03/2017; abdominal ultrasound -09/04/2017 FINDINGS: Five spot intraoperative fluoroscopic images of the right upper abdominal quadrant during ERCP are provided for review. Initial image demonstrates an ERCP probe overlying the right upper abdominal quadrant. There is opacification of the common bile duct which appears moderately dilated distally. There is an apparent filling defect in the distal aspect of the common bile duct. There is faint opacification the pancreatic duct which appears nondilated. Subsequent images demonstrate apparent basket extraction of  the suspected choledocholithiasis. There is minimal opacification of the central aspect of the cystic duct. Multiple filling defects are seen within the gallbladder compatible with known cholelithiasis. There is minimal opacification of the intrahepatic biliary system which appears nondilated. IMPRESSION: 1. Suspected choledocholithiasis with stone extraction as detailed above. 2. Cholelithiasis. These images were submitted for radiologic interpretation only. Please see the procedural report for the amount of contrast and the fluoroscopy time utilized. Electronically Signed   By: Simonne ComeJohn  Watts M.D.   On: 09/04/2017 14:36    Scheduled Meds: . famotidine  20 mg Oral BID  . pantoprazole  40 mg Oral Daily   Continuous Infusions: . sodium chloride 75 mL/hr at 09/04/17 2258  . cefTRIAXone (ROCEPHIN)  IV Stopped (09/04/17 1621)    Principal Problem:   Cholelithiasis with choledocholithiasis Active Problems:   Acute cholecystitis   Elevated LFTs   Jaundice   Nausea and vomiting in adult   Right upper quadrant abdominal pain   GERD (gastroesophageal reflux disease)    Time spent: >35 minutes     Esperanza SheetsBURIEV, Alphons Burgert N  [facility] Pager [phone]. If 7PM-7AM, please contact night-coverage at [email or web address], password [email or web address]  09/05/2017, 9:42 AM  LOS: 1 day

## 2017-09-05 NOTE — Anesthesia Postprocedure Evaluation (Signed)
Anesthesia Post Note  Patient: Kelly Mahoney  Procedure(s) Performed: LAPAROSCOPIC CHOLECYSTECTOMY (N/A )  Patient location during evaluation: PACU Anesthesia Type: General Level of consciousness: awake and patient cooperative Pain management: pain level controlled Vital Signs Assessment: post-procedure vital signs reviewed and stable Respiratory status: spontaneous breathing, nonlabored ventilation and respiratory function stable Cardiovascular status: blood pressure returned to baseline Postop Assessment: no apparent nausea or vomiting Anesthetic complications: no     Last Vitals:  Vitals:   09/05/17 1421 09/05/17 1700  BP: 126/82 135/82  Pulse: 65 78  Resp: 16 12  Temp: 37.4 C 37 C  SpO2: 98% 100%    Last Pain:  Vitals:   09/05/17 1700  TempSrc:   PainSc: 8                  Evolet Salminen J

## 2017-09-06 ENCOUNTER — Encounter (HOSPITAL_COMMUNITY): Payer: Self-pay | Admitting: Internal Medicine

## 2017-09-06 LAB — HEPATIC FUNCTION PANEL
ALBUMIN: 3.6 g/dL (ref 3.5–5.0)
ALK PHOS: 146 U/L — AB (ref 38–126)
ALT: 544 U/L — ABNORMAL HIGH (ref 14–54)
AST: 51 U/L — AB (ref 15–41)
BILIRUBIN TOTAL: 1 mg/dL (ref 0.3–1.2)
Bilirubin, Direct: 0.3 mg/dL (ref 0.1–0.5)
Indirect Bilirubin: 0.7 mg/dL (ref 0.3–0.9)
Total Protein: 6.7 g/dL (ref 6.5–8.1)

## 2017-09-06 MED ORDER — OXYCODONE HCL 5 MG PO TABS: 5.0000 mg | ORAL_TABLET | ORAL | 0 refills | Status: DC | PRN

## 2017-09-06 MED ORDER — PHENOL 1.4 % MT LIQD
1.0000 | OROMUCOSAL | Status: DC | PRN
  Administered 2017-09-06: 1 via OROMUCOSAL
  Filled 2017-09-06: qty 177

## 2017-09-06 NOTE — Anesthesia Postprocedure Evaluation (Signed)
Anesthesia Post Note  Patient: Kelly Mahoney  Procedure(s) Performed: LAPAROSCOPIC CHOLECYSTECTOMY (N/A )  Patient location during evaluation: Nursing Unit Anesthesia Type: General Level of consciousness: awake and alert Pain management: pain level controlled Vital Signs Assessment: post-procedure vital signs reviewed and stable Respiratory status: patient connected to nasal cannula oxygen Cardiovascular status: stable Postop Assessment: no apparent nausea or vomiting Anesthetic complications: no     Last Vitals:  Vitals:   09/05/17 2102 09/06/17 0526  BP:  (!) 104/52  Pulse:  (!) 56  Resp:  18  Temp:  36.9 C  SpO2: 98% 100%    Last Pain:  Vitals:   09/06/17 1004  TempSrc:   PainSc: 0-No pain                 Kelly Mahoney

## 2017-09-06 NOTE — Addendum Note (Signed)
Addendum  created 09/06/17 1400 by Franco NonesYates, Tommy Minichiello S, CRNA   Sign clinical note

## 2017-09-06 NOTE — Discharge Summary (Addendum)
Physician Discharge Summary  Patient ID: Kelly PiaMayra Mahoney MRN: [id number] DOB/AGE: August 08, 1991 26 y.o.  Admit date: 09/03/2017 Discharge date: 09/06/2017  Admission Diagnoses:  Choledocholithiasis   Discharge Diagnoses:  Principal Problem:   Cholelithiasis with choledocholithiasis Active Problems:   GERD (gastroesophageal reflux disease)   Discharged Condition: good  Hospital Course: Kelly Mahoney is a 26 yo with choledocholithiasis who underwent ERCP and had a stone removed, and then underwent a laparoscopic cholecystectomy.  During her hospital stay her LFTs were trending down and her T bili had normalized, and she has been eating and drinking and ambulating. She has good pain control, and is ready to be discharged home.   Consults: GI and hospitalist  Significant Diagnostic Studies:  CT 11/11- cholelithiasis, possible choledocholithiasis given CBD diameter 11mm   Treatments: ERCP -Dr. Karilyn Cotaehman 11/11 with stone extraction and sphincterotomy; Laparoscopic cholecystectomy - Dr. Henreitta LeberBridges 11/12   Discharge Exam: Blood pressure (!) 104/52, pulse (!) 56, temperature 98.5 F (36.9 C), temperature source Oral, resp. rate 18, height 5\' 2"  (1.575 m), weight 194 lb 3.6 oz (88.1 kg), last menstrual period 08/13/2017, SpO2 100 %, currently breastfeeding. General appearance: alert, cooperative and no distress Resp: normal work breathing GI: soft, appropriately tender, port sites c/d/i with dermabond, no erythema or drainage  Extremities: extremities normal, atraumatic, no cyanosis or edema  Labs: 11/13  Results for Kelly Mahoney, Kelly Mahoney (MRN [id number]) as of 09/06/2017 13:30  Ref. Range 09/06/2017 09:51  Alkaline Phosphatase Latest Ref Range: 38 - 126 U/L 146 (H)  Albumin Latest Ref Range: 3.5 - 5.0 g/dL 3.6  AST Latest Ref Range: 15 - 41 U/L 51 (H)  ALT Latest Ref Range: 14 - 54 U/L 544 (H)  Total Protein Latest Ref Range: 6.5 - 8.1 g/dL 6.7  Bilirubin, Direct Latest Ref Range: 0.1 - 0.5 mg/dL 0.3   Indirect Bilirubin Latest Ref Range: 0.3 - 0.9 mg/dL 0.7  Total Bilirubin Latest Ref Range: 0.3 - 1.2 mg/dL 1.0   Discharge Instructions: Shower per your regular routine. Take tylenol and ibuprofen as needed for pain control, alternating every 4-6 hours.  Take Roxicodone for breakthrough pain. Take colace for constipation related to narcotic pain medication. Do not pick at the dermabond glue on your incision sites.  If you ride in a car, take breaks every hour and walk around for at least 10 minutes a stop.    Disposition: 01-Home or Self Care  Discharge Instructions    Call MD for:  difficulty breathing, headache or visual disturbances   Complete by:  As directed    Call MD for:  extreme fatigue   Complete by:  As directed    Call MD for:  persistant dizziness or light-headedness   Complete by:  As directed    Call MD for:  persistant nausea and vomiting   Complete by:  As directed    Call MD for:  redness, tenderness, or signs of infection (pain, swelling, redness, odor or green/yellow discharge around incision site)   Complete by:  As directed    Call MD for:  severe uncontrolled pain   Complete by:  As directed    Call MD for:  temperature >100.4   Complete by:  As directed    Diet - low sodium heart healthy   Complete by:  As directed    Increase activity slowly   Complete by:  As directed      Allergies as of 09/06/2017   No Known Allergies     Medication List  TAKE these medications   albuterol 108 (90 Base) MCG/ACT inhaler Commonly known as:  PROVENTIL HFA;VENTOLIN HFA Inhale 1 puff into the lungs every 6 (six) hours as needed for wheezing or shortness of breath.   hydrOXYzine 25 MG tablet Commonly known as:  ATARAX/VISTARIL Take 25 mg every 8 (eight) hours as needed by mouth for itching.   ibuprofen 600 MG tablet Commonly known as:  ADVIL,MOTRIN Take 1 tablet (600 mg total) by mouth every 6 (six) hours.   NEXPLANON 68 MG Impl implant Generic drug:   etonogestrel 1 each once by Subdermal route.   omeprazole 20 MG capsule Commonly known as:  PRILOSEC Take 20 mg daily by mouth.   ondansetron 4 MG disintegrating tablet Commonly known as:  ZOFRAN-ODT Take 4 mg every 8 (eight) hours as needed by mouth for nausea or vomiting.   oxyCODONE 5 MG immediate release tablet Commonly known as:  Oxy IR/ROXICODONE Take 1 tablet (5 mg total) every 4 (four) hours as needed by mouth for severe pain or breakthrough pain.   predniSONE 10 MG tablet Commonly known as:  DELTASONE Take 10-50 mg See admin instructions by mouth. Starting on 09-02-2017 take five tablets on day 1, then four on day 2, then three on day 3, then two on day 4, then one on day 5, then stop   ranitidine 300 MG tablet Commonly known as:  ZANTAC Take 300 mg at bedtime by mouth.      Follow-up Information    Lucretia RoersBridges, Shantil Vallejo C, MD Follow up in 2 week(s).   Specialty:  General Surgery Contact information: [address] [place] [facility] [id number] [phone]        Aliene BeamsHagler, Rachel, MD Follow up.   Specialty:  Family Medicine Why:  PCP that you can start going to.  Contact information: [address] [place] [address] [phone]           Signed: Lucretia RoersLindsay C Madalaine Portier 09/06/2017, 1:27 PM

## 2017-09-07 ENCOUNTER — Other Ambulatory Visit: Payer: 59

## 2017-09-07 ENCOUNTER — Encounter (HOSPITAL_COMMUNITY): Payer: Self-pay | Admitting: General Surgery

## 2017-09-09 LAB — CULTURE, BLOOD (ROUTINE X 2)
CULTURE: NO GROWTH
CULTURE: NO GROWTH
SPECIAL REQUESTS: ADEQUATE

## 2017-09-20 ENCOUNTER — Encounter: Payer: Self-pay | Admitting: General Surgery

## 2017-09-20 ENCOUNTER — Ambulatory Visit (INDEPENDENT_AMBULATORY_CARE_PROVIDER_SITE_OTHER): Payer: BLUE CROSS/BLUE SHIELD | Admitting: General Surgery

## 2017-09-20 VITALS — BP 129/80 | HR 81 | Temp 98.6°F | Resp 18 | Ht 61.0 in | Wt 195.0 lb

## 2017-09-20 DIAGNOSIS — K8070 Calculus of gallbladder and bile duct without cholecystitis without obstruction: Secondary | ICD-10-CM

## 2017-09-20 NOTE — Progress Notes (Signed)
[facility] Note   HPI:  26 y.o. Female presents to clinic for post-op follow-up evaluation after laparoscopic cholecystectomy. Patient reports that she is doing well and having a little pain at the epigastric site. She otherwise is eating and drinking, and not taking any more pain medications.  Pathology:  Diagnosis Gallbladder - CHRONIC CHOLECYSTITIS, CHOLELITHIASIS, AND CHOLESTEROLOSIS.  Review of Systems:  No fevers or chills  All other review of systems: otherwise negative   Vital Signs:  BP 129/80   Pulse 81   Temp 98.6 F (37 C)   Resp 18   Ht 5\' 1"  (1.549 m)   Wt 195 lb (88.5 kg)   BMI 36.84 kg/m    Physical Exam:  Physical Exam  Constitutional: She is well-developed, well-nourished, and in no distress.  Cardiovascular: Normal rate.  Pulmonary/Chest: Effort normal.  Abdominal: Soft. She exhibits no distension. There is no tenderness.  Port sites c/d/i with dermabond, no erythema or drainage  Vitals reviewed.   Laboratory studies: None   Imaging:  None   Assessment:  26 y.o. yo Female who is post op after a laparoscopic cholecystectomy for chronic cholecystitis and stones. She is doing well.  Plan:  - Follow up PRN   Algis GreenhouseLindsay Bridges, MD [facility] [address] [place], [place] [phone] (office)

## 2018-02-16 ENCOUNTER — Other Ambulatory Visit: Payer: Self-pay | Admitting: Otolaryngology

## 2018-04-21 DIAGNOSIS — Z6838 Body mass index (BMI) 38.0-38.9, adult: Secondary | ICD-10-CM | POA: Diagnosis not present

## 2018-04-21 DIAGNOSIS — K219 Gastro-esophageal reflux disease without esophagitis: Secondary | ICD-10-CM | POA: Diagnosis not present

## 2018-05-22 DIAGNOSIS — K219 Gastro-esophageal reflux disease without esophagitis: Secondary | ICD-10-CM | POA: Diagnosis not present

## 2018-05-22 DIAGNOSIS — Z6838 Body mass index (BMI) 38.0-38.9, adult: Secondary | ICD-10-CM | POA: Diagnosis not present

## 2018-07-17 DIAGNOSIS — Z114 Encounter for screening for human immunodeficiency virus [HIV]: Secondary | ICD-10-CM | POA: Diagnosis not present

## 2018-07-17 DIAGNOSIS — Z13220 Encounter for screening for lipoid disorders: Secondary | ICD-10-CM | POA: Diagnosis not present

## 2018-07-17 DIAGNOSIS — Z1329 Encounter for screening for other suspected endocrine disorder: Secondary | ICD-10-CM | POA: Diagnosis not present

## 2018-07-17 DIAGNOSIS — Z0000 Encounter for general adult medical examination without abnormal findings: Secondary | ICD-10-CM | POA: Diagnosis not present

## 2018-07-21 DIAGNOSIS — Z0000 Encounter for general adult medical examination without abnormal findings: Secondary | ICD-10-CM | POA: Diagnosis not present

## 2018-07-21 DIAGNOSIS — Z23 Encounter for immunization: Secondary | ICD-10-CM | POA: Diagnosis not present

## 2018-07-21 DIAGNOSIS — Z6839 Body mass index (BMI) 39.0-39.9, adult: Secondary | ICD-10-CM | POA: Diagnosis not present

## 2018-08-02 ENCOUNTER — Ambulatory Visit: Payer: 59 | Admitting: Nutrition

## 2018-09-29 IMAGING — DX DG CHEST 2V
2 series · 2 of 2 positions shown · non-contrast
Comparison: None.

CLINICAL DATA: 26 y/o  F; abdominal pain, nausea, vomiting.

EXAM:
CHEST  2 VIEW

[chest pa]
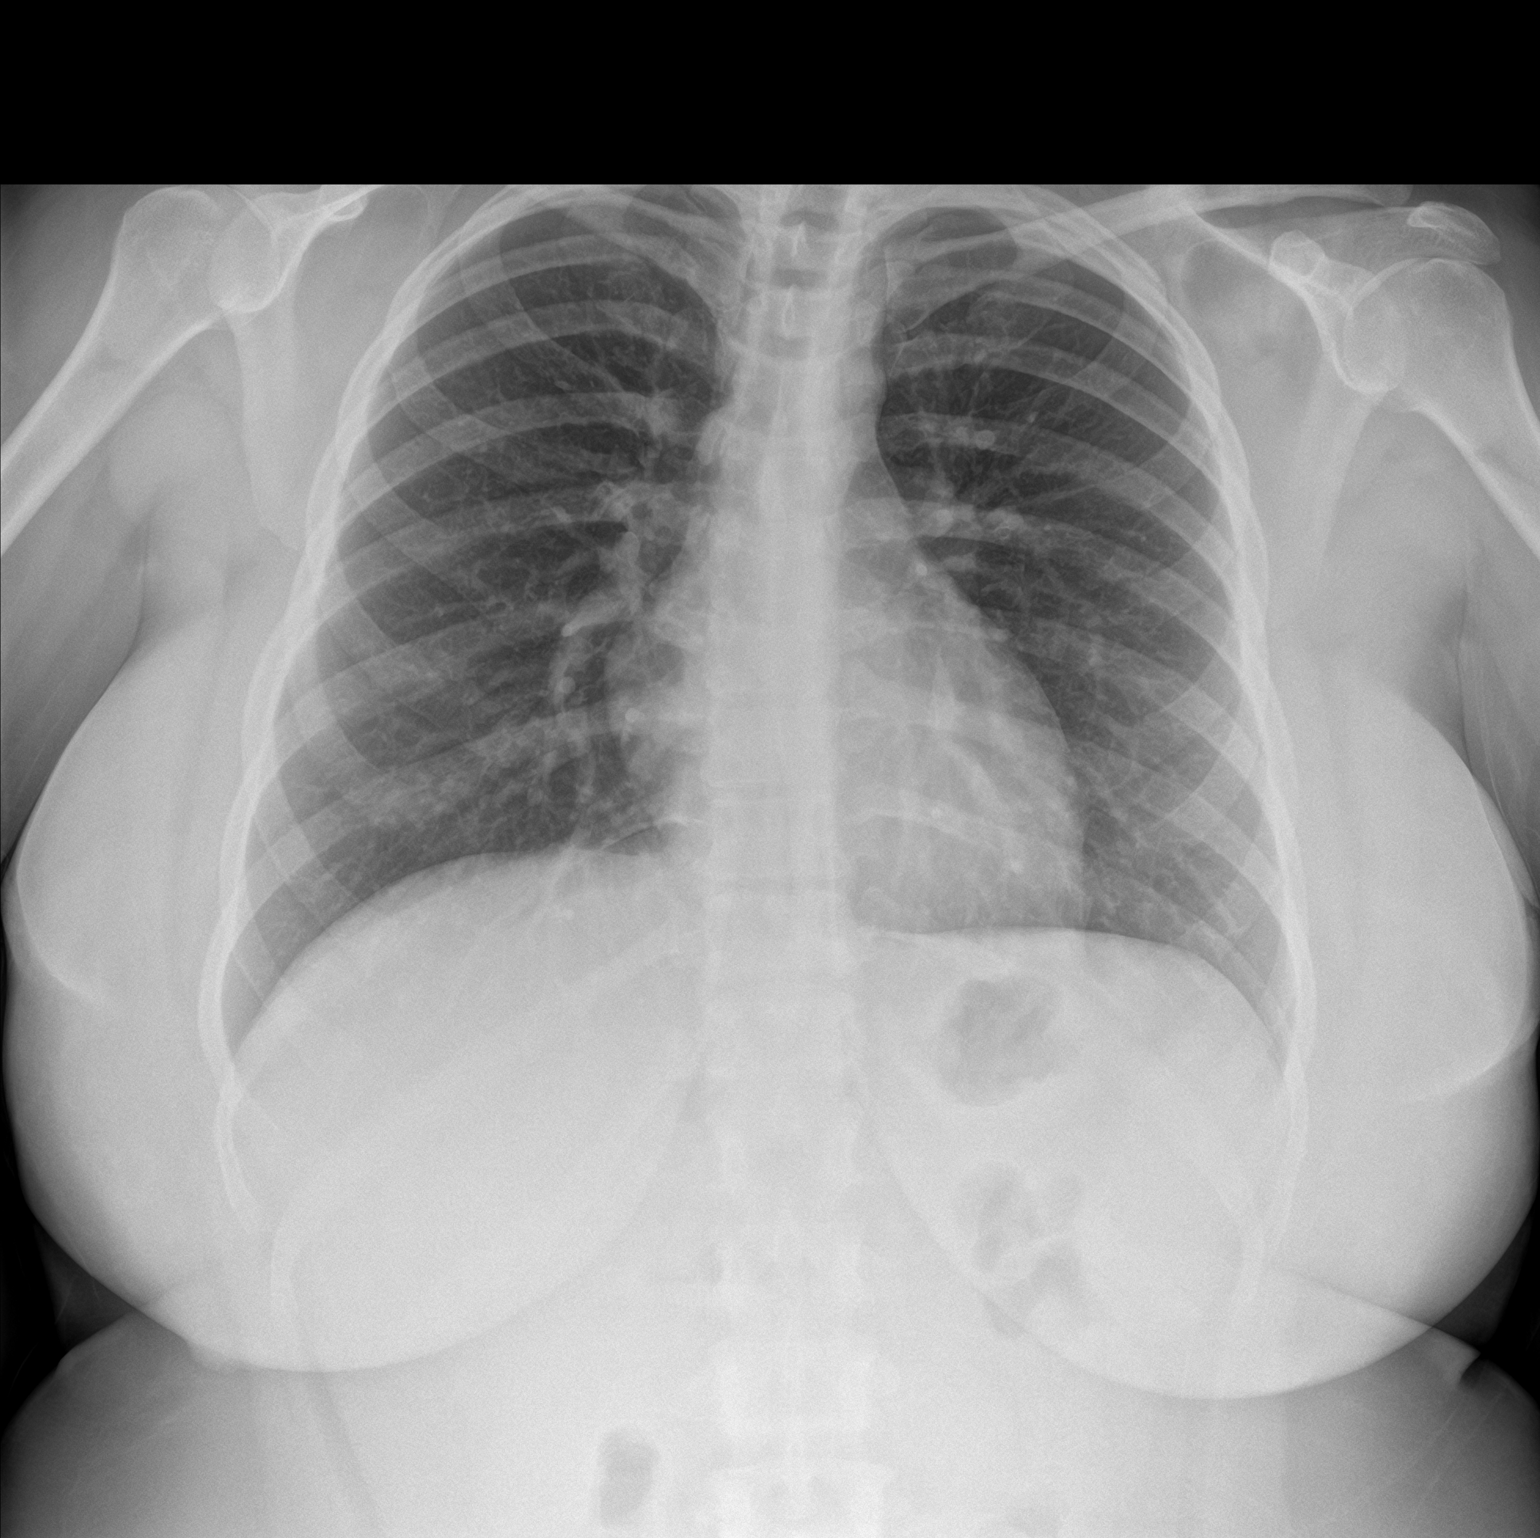

[chest lat]
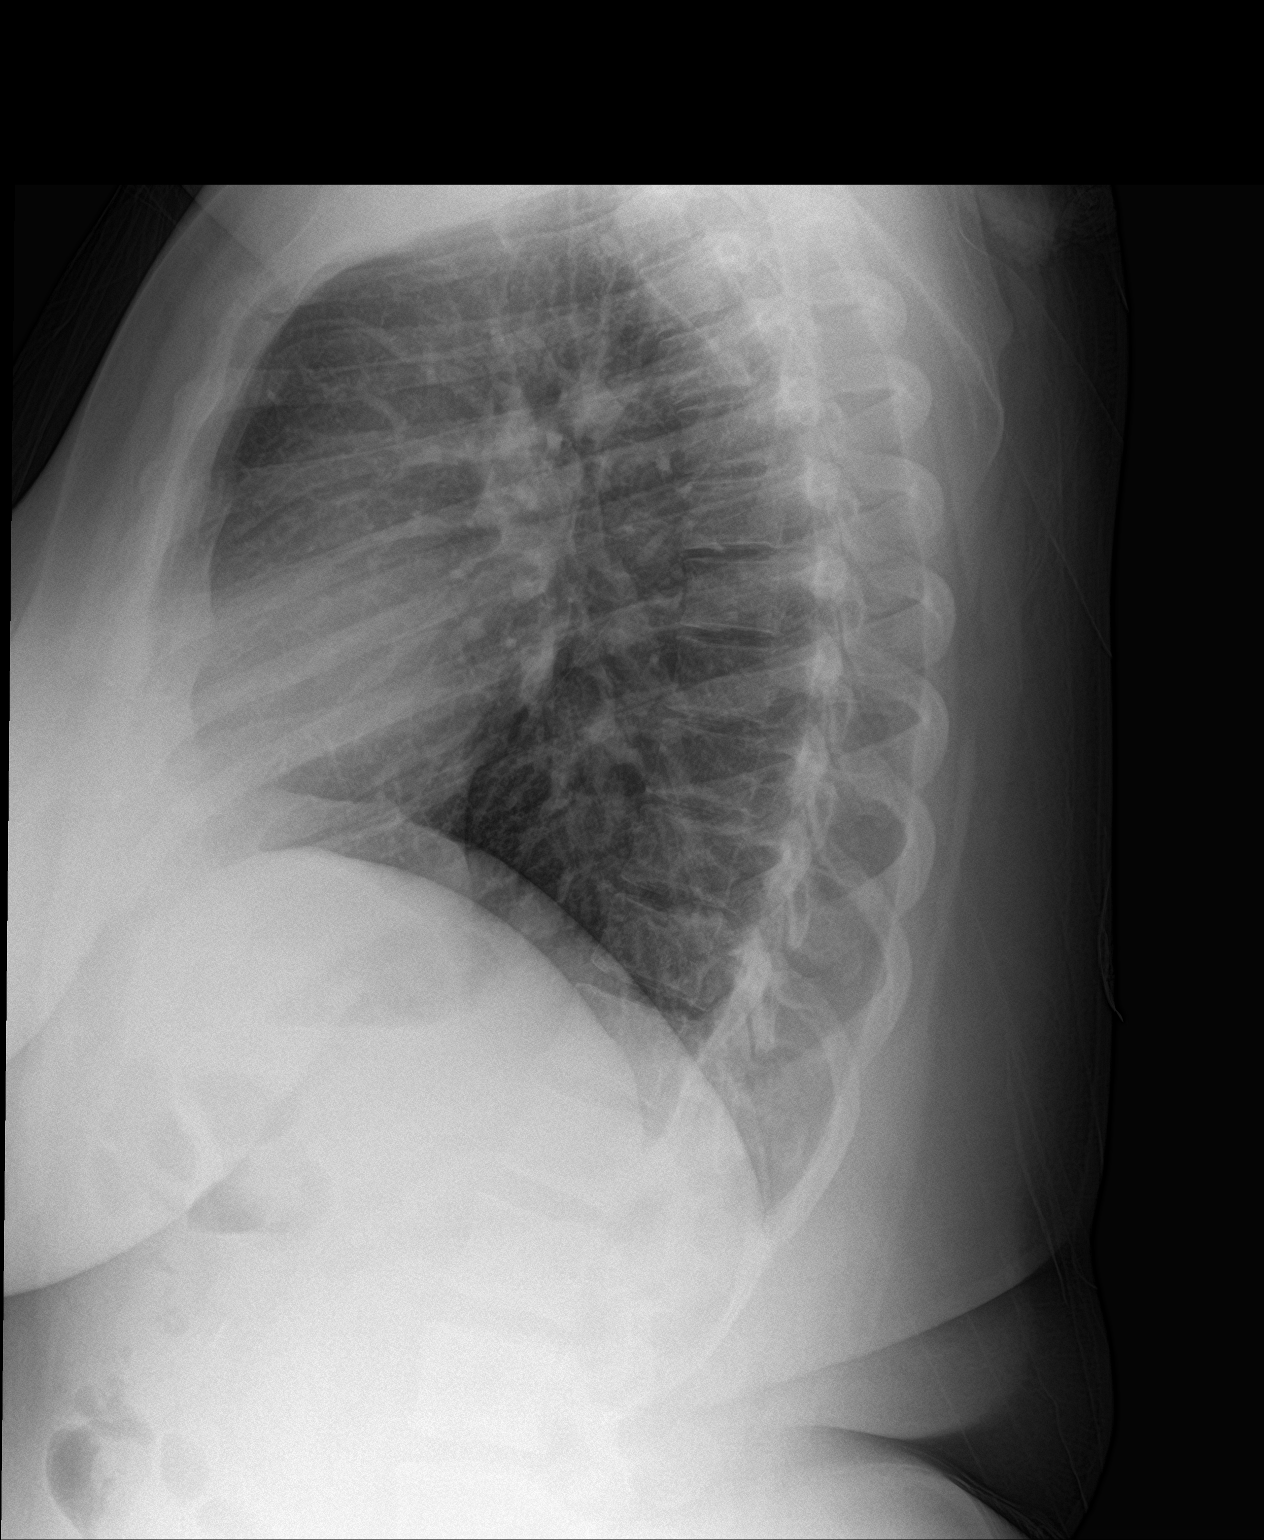

[2 of 2 positions shown; findings below may reference images not displayed]

FINDINGS: The heart size and mediastinal contours are within normal limits.
Both lungs are clear. The visualized skeletal structures are
unremarkable.
IMPRESSION: No active cardiopulmonary disease.

By: Roberto Tiger M.D.

## 2018-09-30 IMAGING — RF DG ERCP WO/W SPHINCTEROTOMY
1 series · 5 of 5 positions shown · non-contrast
Comparison: CT abdomen pelvis - 09/03/2017; abdominal ultrasound
-09/04/2017

CLINICAL DATA: Common bile duct stone

EXAM:
ERCP
TECHNIQUE: Multiple spot images obtained with the fluoroscopic device and
submitted for interpretation post-procedure.
FLUOROSCOPY TIME:  16 seconds

[Series 1: run · 5 of 5 slices shown]
[im 1/5]
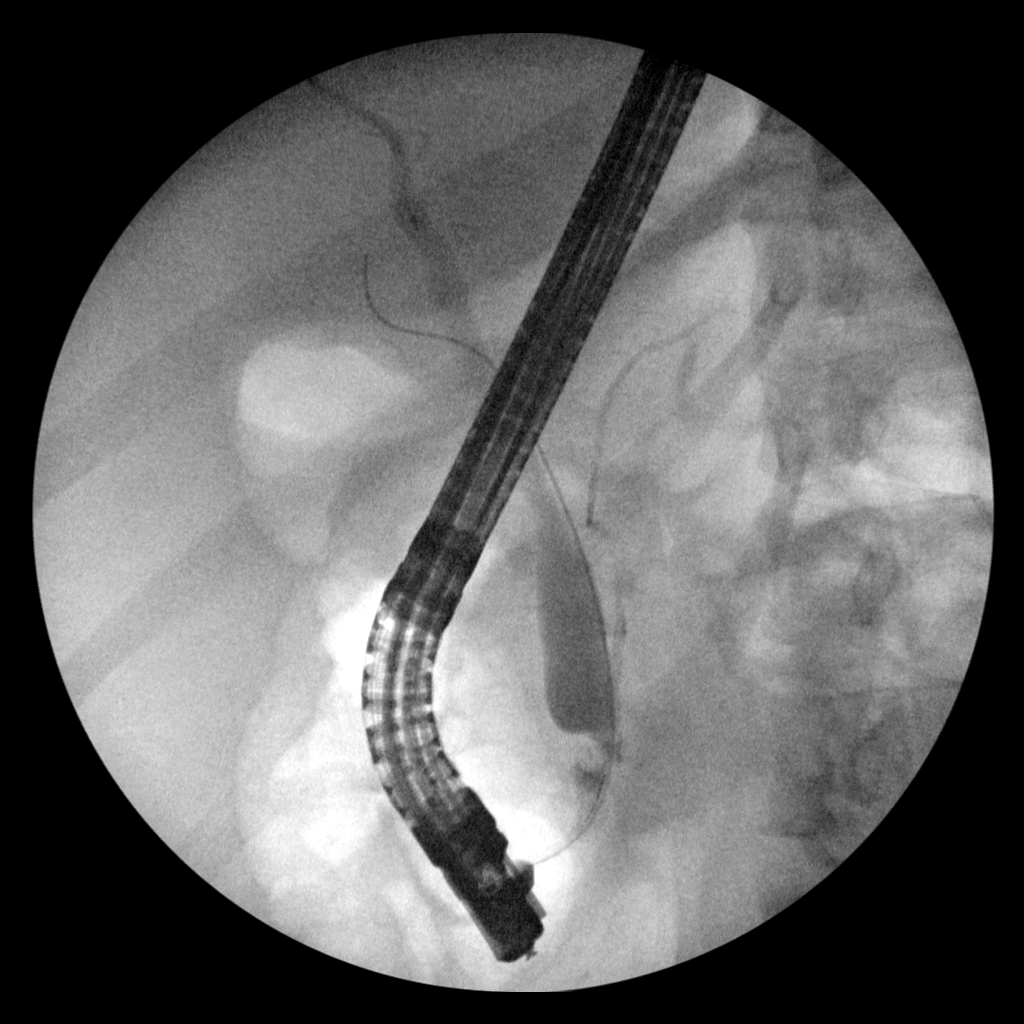
[im 2/5]
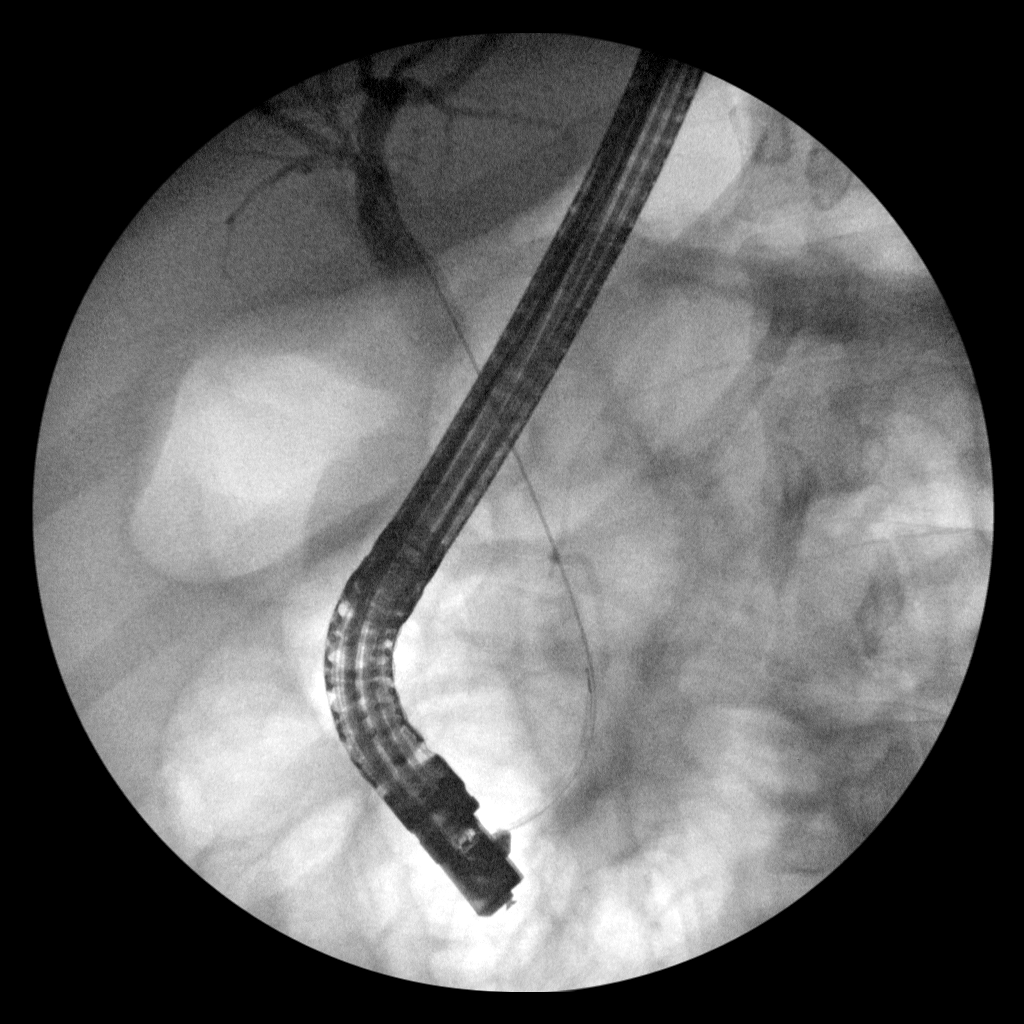
[im 3/5]
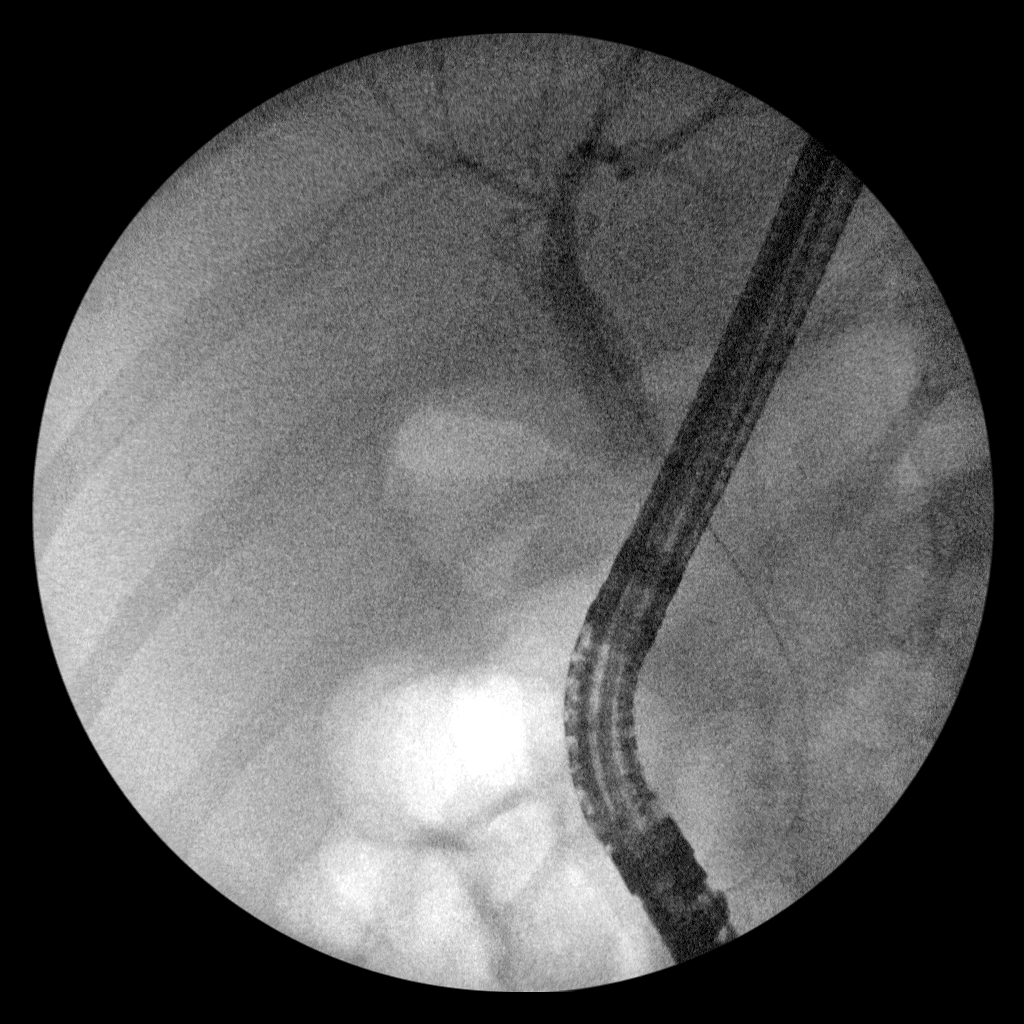
[im 4/5]
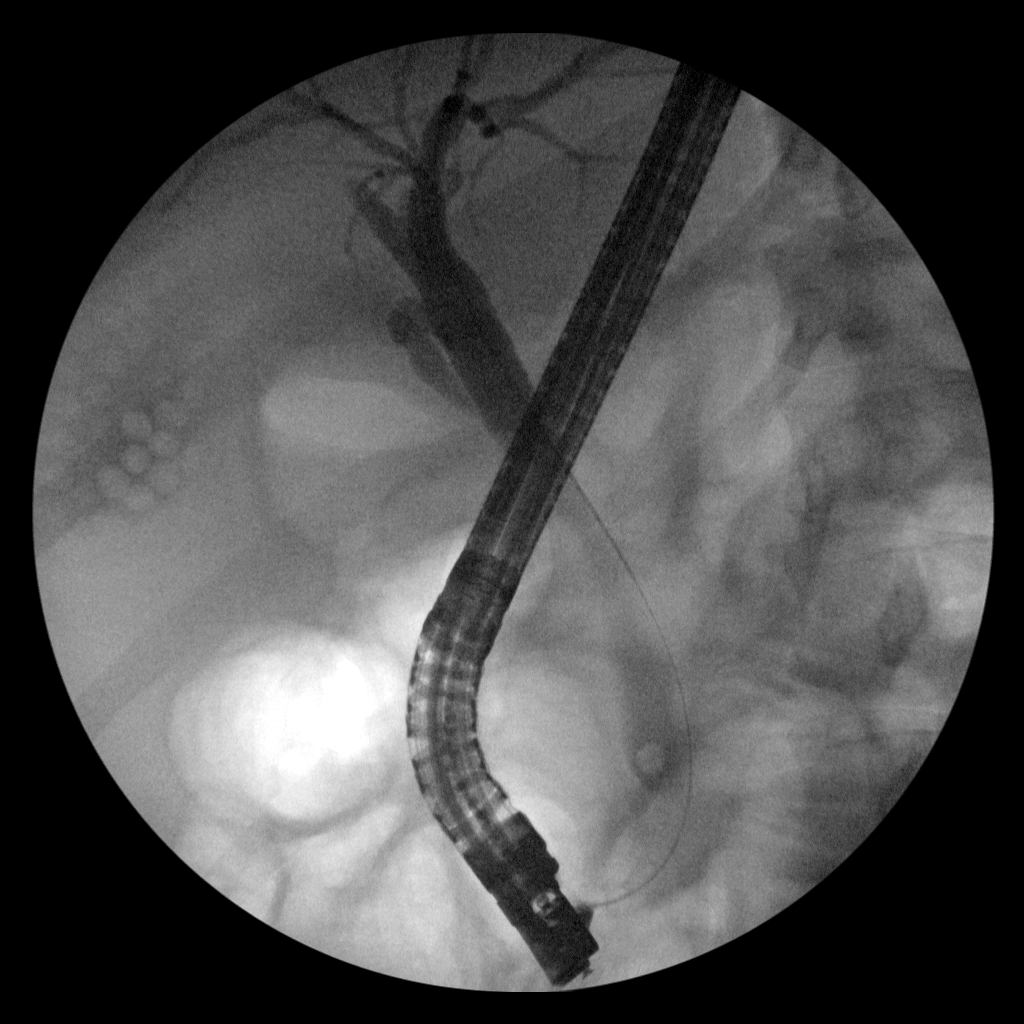
[im 5/5]
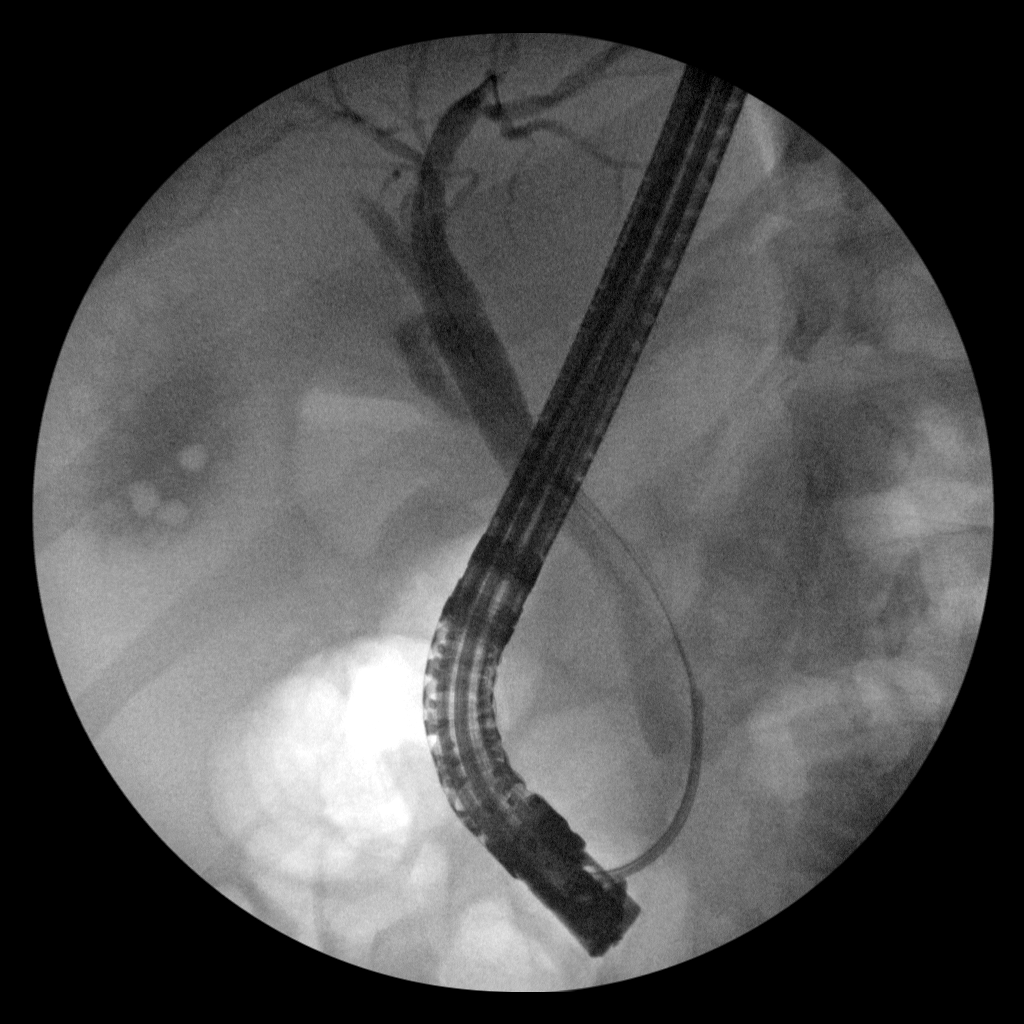

[5 of 5 positions shown; findings below may reference images not displayed]

FINDINGS: Five spot intraoperative fluoroscopic images of the right upper
abdominal quadrant during ERCP are provided for review.

Initial image demonstrates an ERCP probe overlying the right upper
abdominal quadrant. There is opacification of the common bile duct
which appears moderately dilated distally. There is an apparent
filling defect in the distal aspect of the common bile duct. There
is faint opacification the pancreatic duct which appears nondilated.
Subsequent images demonstrate apparent basket extraction of the
suspected choledocholithiasis.

There is minimal opacification of the central aspect of the cystic
duct.

Multiple filling defects are seen within the gallbladder compatible
with known cholelithiasis.

There is minimal opacification of the intrahepatic biliary system
which appears nondilated.
IMPRESSION: 1. Suspected choledocholithiasis with stone extraction as detailed
above.
2. Cholelithiasis.
These images were submitted for radiologic interpretation only.
Please see the procedural report for the amount of contrast and the
fluoroscopy time utilized.

## 2019-04-21 IMAGING — US US ABDOMEN COMPLETE
1 series · 13 of 25 positions shown · non-contrast
Comparison: CT abdomen pelvis -09/03/2017

CLINICAL DATA: Elevated LFTs and abdominal pain for the past 3
days. Evaluate for cholecystitis.

EXAM:
ABDOMEN ULTRASOUND COMPLETE

[Series 1: us abdomen complete · 0.22mm/px · 13 of 128 slices shown]
[im 1/128]
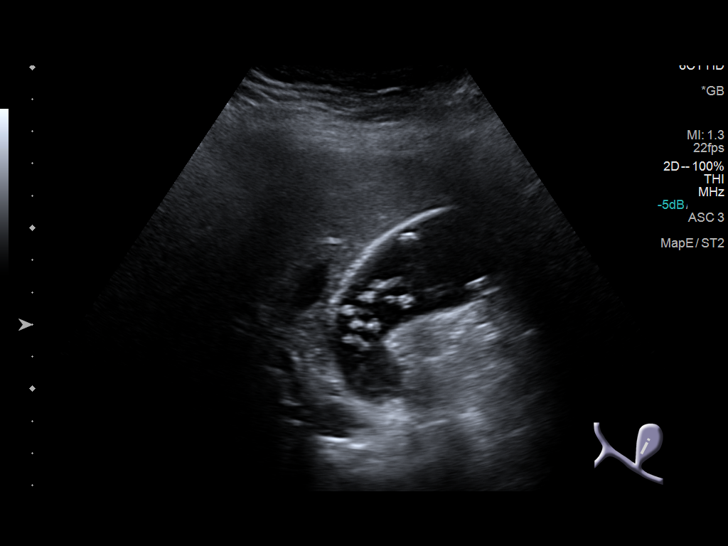
[im 11/128]
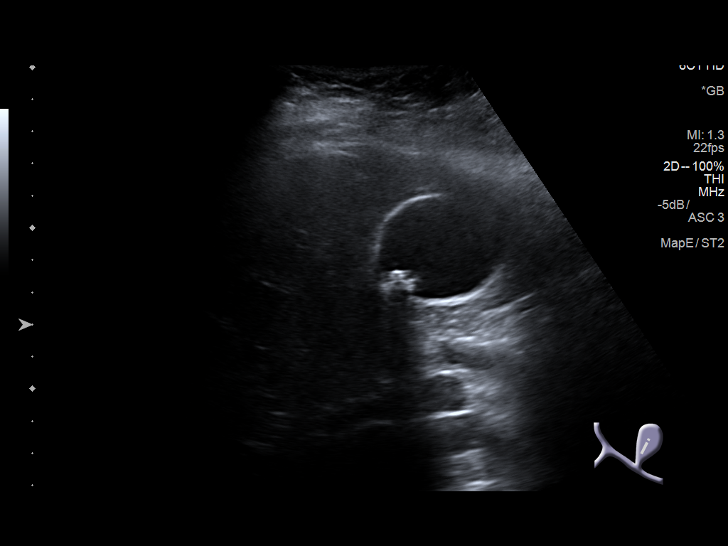
[im 22/128]
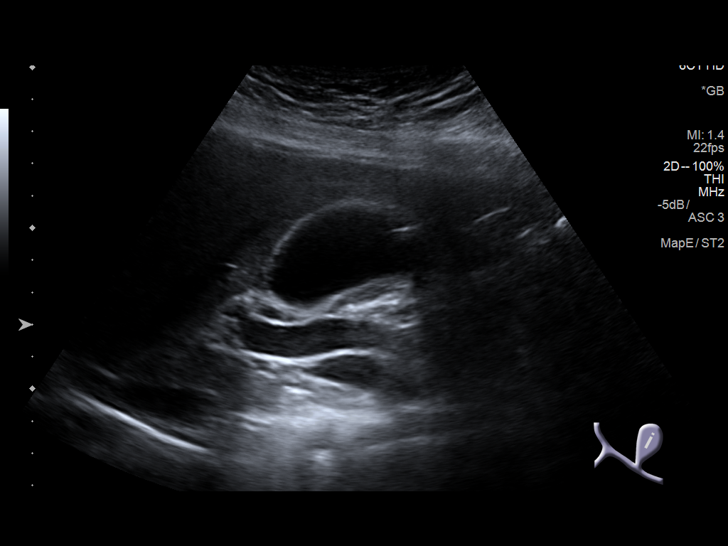
[im 32/128]
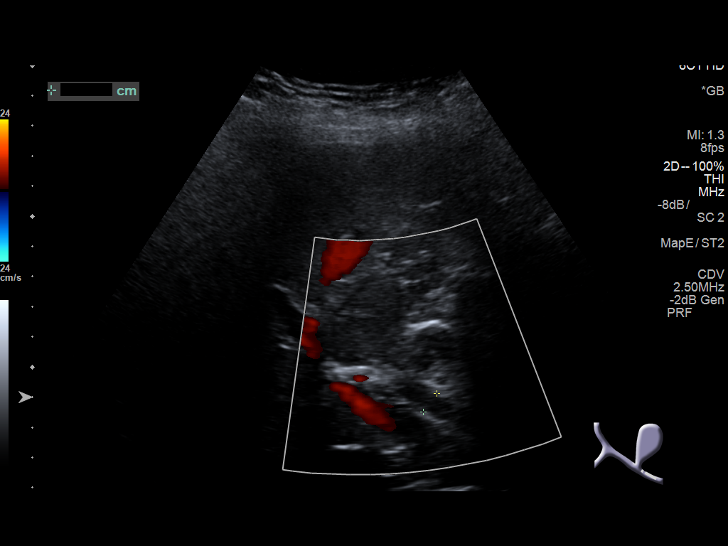
[im 43/128]
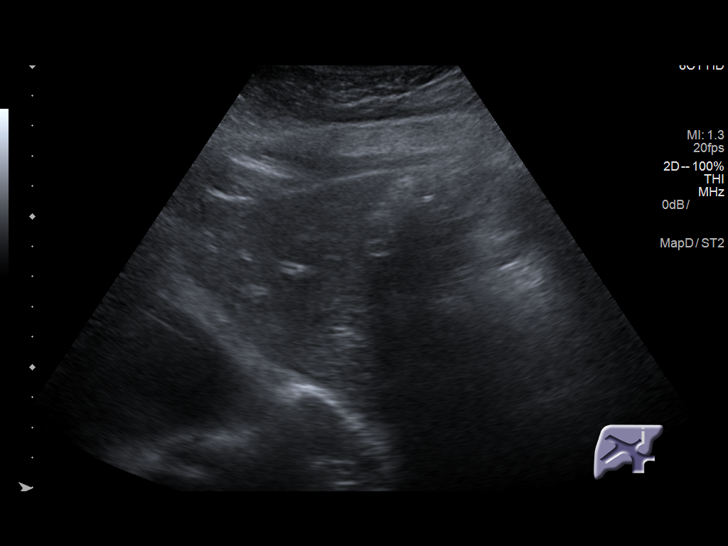
[im 53/128]
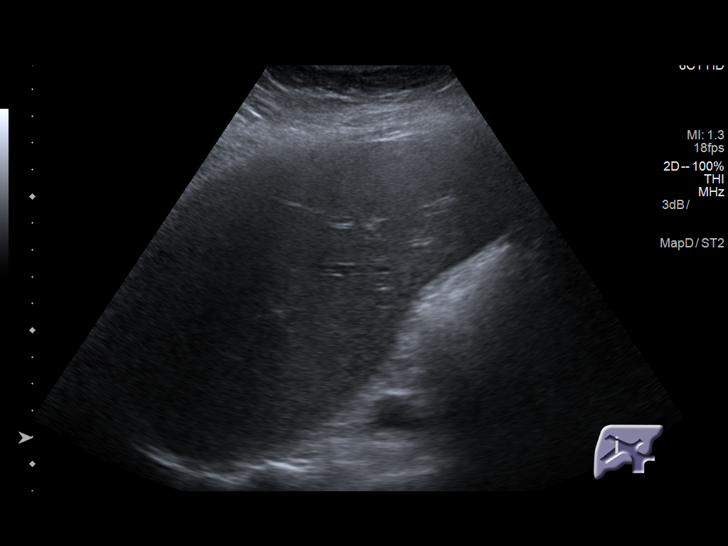
[im 64/128]
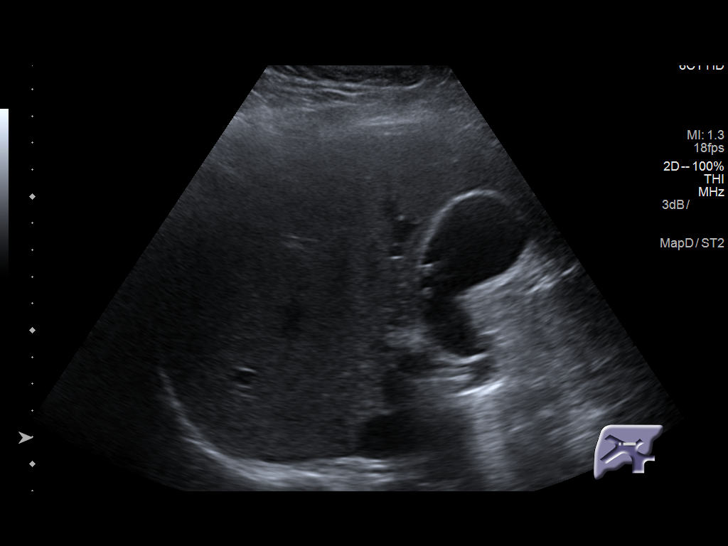
[im 75/128]
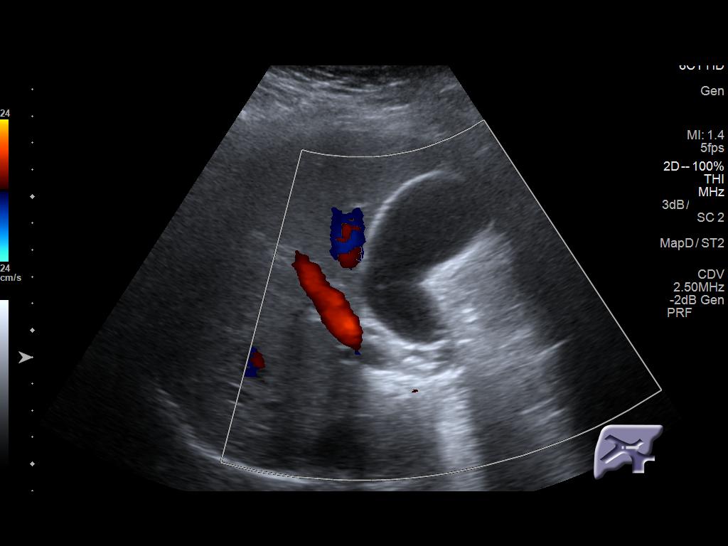
[im 85/128]
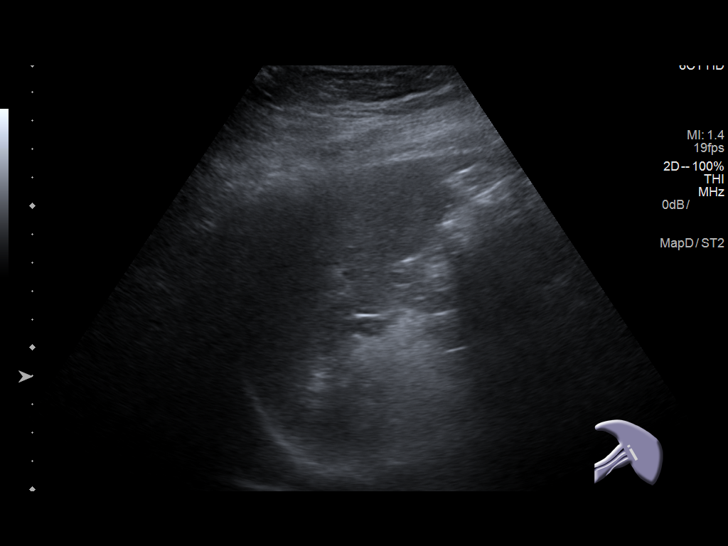
[im 96/128]
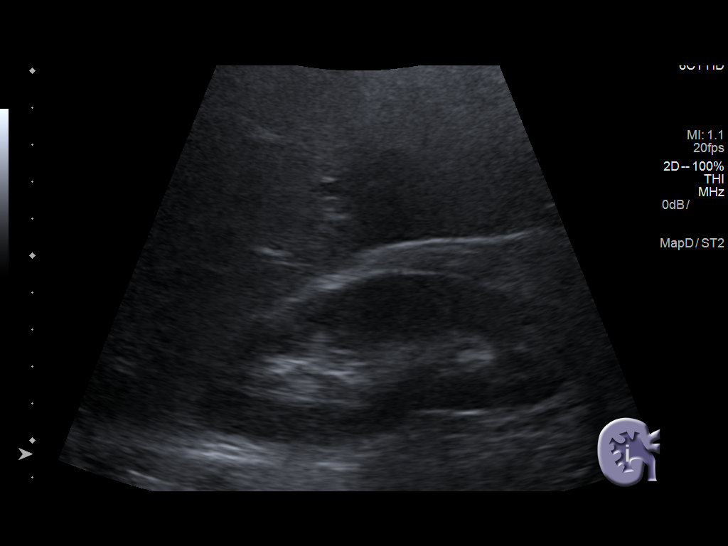
[im 106/128]
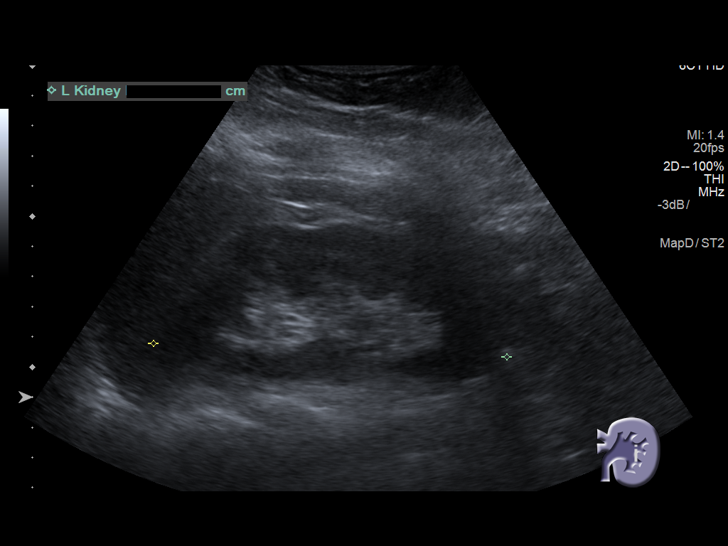
[im 117/128]
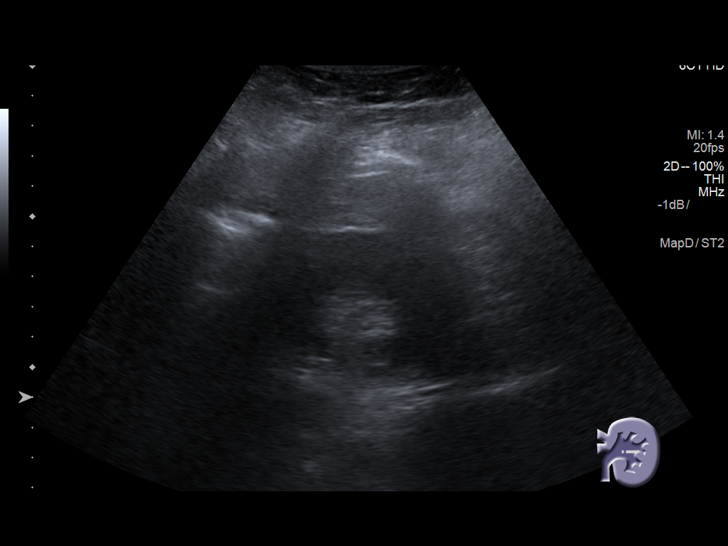
[im 128/128]
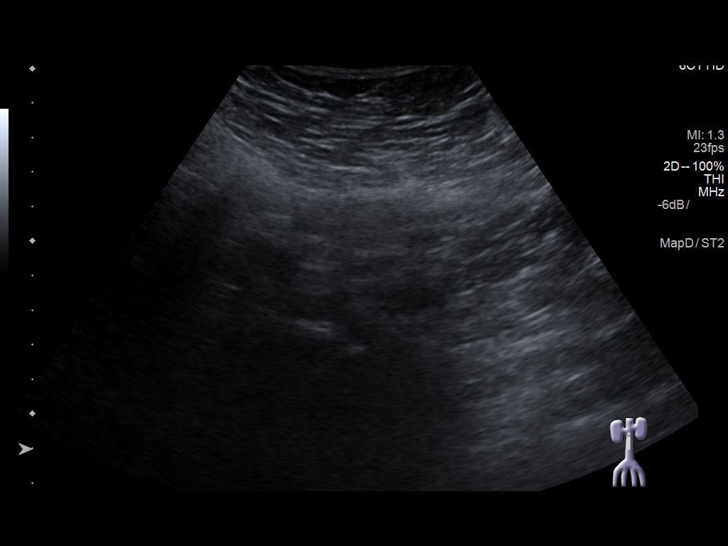

[13 of 25 positions shown; findings below may reference images not displayed]

FINDINGS: Gallbladder: There are multiple echogenic stones seen throughout the
gallbladder. Suspected trace amount of pericholecystic fluid (image
4). No gallbladder wall thickening. Negative sonographic Murphy's
sign.

Common bile duct: Diameter: Enlarged measuring 1.1 cm in diameter,
the etiology of which is not depicted on this examination peer

Liver: There is mild increased echogenicity of the hepatic
parenchyma. Suspected minimal amount of focal fatty sparing adjacent
to the gallbladder fossa. No discrete hepatic lesions. No
intrahepatic biliary ductal dilatation. No ascites. Portal vein is
patent on color Doppler imaging with normal direction of blood flow
towards the liver.

IVC: No abnormality visualized.

Pancreas: Limited visualization of the pancreatic head and neck is
normal. Visualization of the pancreatic body and tail is obscured by
bowel gas.

Spleen: Normal in size measuring 7.3 cm in length

Right Kidney: Normal cortical thickness, echogenicity and size,
measuring 9.9 cm in length. No focal renal lesions. No echogenic
renal stones. No urinary obstruction.

Left Kidney: Normal cortical thickness, echogenicity and size,
measuring 11.7 cm in length. No focal renal lesions. No echogenic
renal stones. No urinary obstruction.

Abdominal aorta: No aneurysm visualized.

Other findings: None.
IMPRESSION: 1. Extensive cholelithiasis with indeterminate findings of acute
cholecystitis with trace amount of pericholecystic fluid the no
gallbladder wall thickening and negative sonographic Murphy's sign.
If clinical concern persists for acute cholecystitis, further
evaluation with nuclear medicine HIDA scan could be performed as
indicated.
2. Dilated common bile duct, the etiology of which is not depicted
on this examination. If cholecystectomy with intraoperative
cholangiogram is not pursued, further evaluation with MRCP or ERCP
could be performed as indicated.
3. Suspected hepatic steatosis.

## 2019-05-01 DIAGNOSIS — H6090 Unspecified otitis externa, unspecified ear: Secondary | ICD-10-CM | POA: Diagnosis not present

## 2019-05-01 DIAGNOSIS — H109 Unspecified conjunctivitis: Secondary | ICD-10-CM | POA: Diagnosis not present

## 2019-05-01 DIAGNOSIS — M67833 Other specified disorders of tendon, right wrist: Secondary | ICD-10-CM | POA: Diagnosis not present

## 2019-05-01 DIAGNOSIS — M67834 Other specified disorders of tendon, left wrist: Secondary | ICD-10-CM | POA: Diagnosis not present

## 2019-05-24 ENCOUNTER — Encounter: Payer: Self-pay | Admitting: Advanced Practice Midwife

## 2019-05-24 ENCOUNTER — Ambulatory Visit (INDEPENDENT_AMBULATORY_CARE_PROVIDER_SITE_OTHER): Payer: BC Managed Care – PPO | Admitting: Advanced Practice Midwife

## 2019-05-24 ENCOUNTER — Other Ambulatory Visit: Payer: Self-pay

## 2019-05-24 VITALS — BP 132/72 | HR 96 | Ht 61.0 in | Wt 213.0 lb

## 2019-05-24 DIAGNOSIS — Z3046 Encounter for surveillance of implantable subdermal contraceptive: Secondary | ICD-10-CM

## 2019-05-24 NOTE — Progress Notes (Signed)
HPI:  Kelly Mahoney 28 y.o. here for Nexplanon removal.  Her future plans for birth control are nothing .Wants to have a baby.  Past Medical History: Past Medical History:  Diagnosis Date  . GERD (gastroesophageal reflux disease)     Past Surgical History: Past Surgical History:  Procedure Laterality Date  . CHOLECYSTECTOMY N/A 09/05/2017   Procedure: LAPAROSCOPIC CHOLECYSTECTOMY;  Surgeon: Virl Cagey, MD;  Location: [facility];  Service: General;  Laterality: N/A;  . ERCP N/A 09/04/2017   Procedure: ENDOSCOPIC RETROGRADE CHOLANGIOPANCREATOGRAPHY (ERCP);  Surgeon: Rogene Houston, MD;  Location: [facility];  Service: Endoscopy;  Laterality: N/A;  . NO PAST SURGERIES    . SPHINCTEROTOMY N/A 09/04/2017   Procedure: SPHINCTEROTOMY , STONE BASKET EXTRACTION;  Surgeon: Rogene Houston, MD;  Location: [facility];  Service: Endoscopy;  Laterality: N/A;    Family History: Family History  Problem Relation Age of Onset  . Hyperlipidemia Father   . Hyperlipidemia Mother   . Liver disease Mother        Fatty Liver, possibly elevated LFTs    Social History: Social History   Tobacco Use  . Smoking status: Never Smoker  . Smokeless tobacco: Never Used  Substance Use Topics  . Alcohol use: No  . Drug use: No    Allergies: No Known Allergies  Meds: (Not in a hospital admission)     Patient given informed consent for removal of her Nexplanon, time out was performed.  Signed copy in the chart.  Appropriate time out taken. Implanon site identified.  Area prepped in usual sterile fashon. One cc of 1% lidocaine was used to anesthetize the area at the distal end of the implant. A small stab incision was made right beside the implant on the distal portion.  The Nexplanon rod was grasped using hemostats and removed without difficulty.  There was less than 3 cc blood loss. There were no complications.  A small amount of antibiotic ointment and steri-strips were applied over the small  incision.  A pressure bandage was applied to reduce any bruising.  The patient tolerated the procedure well and was given post procedure instructions.   Start PNV

## 2020-02-05 ENCOUNTER — Telehealth: Payer: Self-pay | Admitting: Adult Health

## 2020-02-05 NOTE — Telephone Encounter (Signed)
Telephoned patient at home number. Patient states took pregnancy test and was positive two days ago. Patient having light pink blood when wipes, mild cramping on Sunday, and no recent sexual intercourse. Advised patient if starts having severe abdominal pain or clots may need to be checked or go to R. No sex for 7 days. Patient voiced understanding.

## 2020-02-05 NOTE — Telephone Encounter (Signed)
Pt states that she is seeing bright red blood each times she wipes and is wanting to speak with a nurse.

## 2020-02-20 ENCOUNTER — Other Ambulatory Visit: Payer: Self-pay

## 2020-02-20 ENCOUNTER — Telehealth: Payer: Self-pay | Admitting: Women's Health

## 2020-02-20 NOTE — Telephone Encounter (Signed)
Pt had appt today for Preg test was canceled because she was out of town and had + preg test done and was having bleeding they done labs and told her she needed to have them repeated to see what her numbers were/ was done in [place] she said she has her results on her my chart ( not cone my chart) please call pt and advise if she needs to make appt or if we can just put order in to repeat labs and then see what to do from there. Please call pt and advise.  Sa

## 2020-02-21 LAB — BETA HCG QUANT (REF LAB): hCG Quant: <1 m[IU]/mL

## 2020-08-19 ENCOUNTER — Other Ambulatory Visit: Payer: Self-pay

## 2020-08-19 ENCOUNTER — Encounter: Payer: Self-pay | Admitting: *Deleted

## 2020-08-19 ENCOUNTER — Ambulatory Visit: Payer: Managed Care, Other (non HMO) | Admitting: *Deleted

## 2020-08-19 VITALS — BP 111/77 | HR 78 | Ht 61.0 in | Wt 209.0 lb

## 2020-08-19 DIAGNOSIS — Z3201 Encounter for pregnancy test, result positive: Secondary | ICD-10-CM

## 2020-08-19 LAB — POCT URINE PREGNANCY: Preg Test, Ur: POSITIVE — AB

## 2020-08-19 MED ORDER — PRENATAL PLUS IRON 29-1 MG PO TABS: ORAL_TABLET | ORAL | 12 refills | Status: DC

## 2020-08-19 NOTE — Addendum Note (Signed)
Addended by: Cyril Mourning A on: 08/19/2020 01:14 PM   Modules accepted: Orders

## 2020-08-19 NOTE — Progress Notes (Signed)
   NURSE VISIT- PREGNANCY CONFIRMATION   SUBJECTIVE:  Kelly Mahoney is a 29 y.o. G72P1001 female at [redacted]w[redacted]d by certain LMP of Patient's last menstrual period was 07/11/2020. Here for pregnancy confirmation.  Home pregnancy test: positive x 1  She reports cramping.  She is not taking prenatal vitamins.    OBJECTIVE:  BP 111/77 (BP Location: Left Arm, Patient Position: Sitting, Cuff Size: Large)   Pulse 78   Ht 5\' 1"  (1.549 m)   Wt 209 lb (94.8 kg)   LMP 07/11/2020   BMI 39.49 kg/m   Appears well, in no apparent distress OB History  Gravida Para Term Preterm AB Living  2 1 1     1   SAB TAB Ectopic Multiple Live Births        0 1    # Outcome Date GA Lbr Len/2nd Weight Sex Delivery Anes PTL Lv  2 Current           1 Term 03/30/17 [redacted]w[redacted]d 13:40 / 02:16 9 lb 5 oz (4.224 kg) F Vag-Spont EPI  LIV    Results for orders placed or performed in visit on 08/19/20 (from the past 24 hour(s))  POCT urine pregnancy   Collection Time: 08/19/20  9:43 AM  Result Value Ref Range   Preg Test, Ur Positive (A) Negative    ASSESSMENT: Positive pregnancy test, [redacted]w[redacted]d by LMP    PLAN: Schedule for dating ultrasound in 3 weeks Prenatal vitamins: note routed to [facility] to send prescription   Nausea medicines: not currently needed   OB packet given: Yes  08/21/20  08/19/2020 9:53 AM

## 2020-08-19 NOTE — Progress Notes (Signed)
Chart reviewed for nurse visit. Agree with plan of care. Rx prenatal plus Adline Potter, NP 08/19/2020 1:13 PM

## 2020-09-04 ENCOUNTER — Other Ambulatory Visit: Payer: Self-pay | Admitting: Advanced Practice Midwife

## 2020-09-04 ENCOUNTER — Telehealth: Payer: Self-pay | Admitting: Advanced Practice Midwife

## 2020-09-04 MED ORDER — PROMETHAZINE HCL 25 MG PO TABS: 25.0000 mg | ORAL_TABLET | Freq: Four times a day (QID) | ORAL | 1 refills | Status: DC | PRN

## 2020-09-04 NOTE — Telephone Encounter (Signed)
Pt having nausea & vomiting, would like something called in  Please advise & notify pt   [facility] [address]

## 2020-09-04 NOTE — Telephone Encounter (Signed)
Done

## 2020-09-08 ENCOUNTER — Other Ambulatory Visit: Payer: Self-pay | Admitting: Obstetrics & Gynecology

## 2020-09-08 DIAGNOSIS — O3680X0 Pregnancy with inconclusive fetal viability, not applicable or unspecified: Secondary | ICD-10-CM

## 2020-09-09 ENCOUNTER — Other Ambulatory Visit: Payer: Self-pay

## 2020-09-09 ENCOUNTER — Ambulatory Visit (INDEPENDENT_AMBULATORY_CARE_PROVIDER_SITE_OTHER): Payer: Managed Care, Other (non HMO)

## 2020-09-09 DIAGNOSIS — Z3A08 8 weeks gestation of pregnancy: Secondary | ICD-10-CM | POA: Diagnosis not present

## 2020-09-09 DIAGNOSIS — O3680X0 Pregnancy with inconclusive fetal viability, not applicable or unspecified: Secondary | ICD-10-CM

## 2020-09-09 NOTE — Progress Notes (Signed)
[name] 8+4 wks single IUP with YS,CRL 18.3 mm=8+2 wks,GS size discrepancy 21.0 mm=7 wk,subchorionic hemorrhage 4.1 x .7 x 1.9 cm,normal ovaries

## 2020-09-23 ENCOUNTER — Other Ambulatory Visit: Payer: Self-pay | Admitting: Women's Health

## 2020-09-23 ENCOUNTER — Telehealth: Payer: Self-pay | Admitting: *Deleted

## 2020-09-23 MED ORDER — BONJESTA 20-20 MG PO TBCR: 1.0000 | EXTENDED_RELEASE_TABLET | Freq: Every day | ORAL | 8 refills | Status: DC

## 2020-09-23 NOTE — Telephone Encounter (Signed)
Patient states she is still having nausea despite taking the Phenergan as prescribed.  She is requesting something else if possible.

## 2020-09-30 ENCOUNTER — Telehealth: Payer: Self-pay | Admitting: *Deleted

## 2020-09-30 ENCOUNTER — Other Ambulatory Visit: Payer: Self-pay | Admitting: Women's Health

## 2020-09-30 MED ORDER — BONJESTA 20-20 MG PO TBCR: 1.0000 | EXTENDED_RELEASE_TABLET | Freq: Every day | ORAL | 8 refills | Status: AC

## 2020-09-30 NOTE — Telephone Encounter (Signed)
Louie Bun to send [place] script back to [facility]. [facility] in [place] says it's not covered under her plan. Left message letting pt know it will be sent back to [facility]. JSY

## 2020-10-02 ENCOUNTER — Other Ambulatory Visit: Payer: Self-pay | Admitting: Obstetrics & Gynecology

## 2020-10-02 DIAGNOSIS — Z3682 Encounter for antenatal screening for nuchal translucency: Secondary | ICD-10-CM

## 2020-10-03 ENCOUNTER — Ambulatory Visit (INDEPENDENT_AMBULATORY_CARE_PROVIDER_SITE_OTHER): Payer: Managed Care, Other (non HMO) | Admitting: Women's Health

## 2020-10-03 ENCOUNTER — Ambulatory Visit (INDEPENDENT_AMBULATORY_CARE_PROVIDER_SITE_OTHER): Payer: Managed Care, Other (non HMO)

## 2020-10-03 ENCOUNTER — Encounter: Payer: Self-pay | Admitting: Women's Health

## 2020-10-03 ENCOUNTER — Other Ambulatory Visit: Payer: Self-pay

## 2020-10-03 ENCOUNTER — Other Ambulatory Visit (HOSPITAL_COMMUNITY)
Admission: RE | Admit: 2020-10-03 | Discharge: 2020-10-03 | Disposition: A | Discharge status: Home / Self Care | Payer: Managed Care, Other (non HMO) | Source: Ambulatory Visit | Attending: Obstetrics & Gynecology | Admitting: Obstetrics & Gynecology

## 2020-10-03 ENCOUNTER — Ambulatory Visit: Payer: Managed Care, Other (non HMO) | Admitting: *Deleted

## 2020-10-03 VITALS — BP 120/71 | HR 78 | Wt 199.0 lb

## 2020-10-03 DIAGNOSIS — Z3490 Encounter for supervision of normal pregnancy, unspecified, unspecified trimester: Secondary | ICD-10-CM | POA: Insufficient documentation

## 2020-10-03 DIAGNOSIS — Z1389 Encounter for screening for other disorder: Secondary | ICD-10-CM

## 2020-10-03 DIAGNOSIS — Z331 Pregnant state, incidental: Secondary | ICD-10-CM

## 2020-10-03 DIAGNOSIS — Z3480 Encounter for supervision of other normal pregnancy, unspecified trimester: Secondary | ICD-10-CM | POA: Insufficient documentation

## 2020-10-03 DIAGNOSIS — Z3A12 12 weeks gestation of pregnancy: Secondary | ICD-10-CM

## 2020-10-03 DIAGNOSIS — Z131 Encounter for screening for diabetes mellitus: Secondary | ICD-10-CM

## 2020-10-03 DIAGNOSIS — Z3481 Encounter for supervision of other normal pregnancy, first trimester: Secondary | ICD-10-CM

## 2020-10-03 DIAGNOSIS — Z3682 Encounter for antenatal screening for nuchal translucency: Secondary | ICD-10-CM

## 2020-10-03 LAB — POCT URINALYSIS DIPSTICK OB
Glucose, UA: NEGATIVE
Ketones, UA: NEGATIVE
Leukocytes, UA: NEGATIVE
Nitrite, UA: NEGATIVE

## 2020-10-03 NOTE — Progress Notes (Signed)
INITIAL OBSTETRICAL VISIT Patient name: Kelly Mahoney MRN [id number]  Date of birth: 11/25/1990 Chief Complaint:   Initial Prenatal Visit (nausea)  History of Present Illness:   Kelly Mahoney is a 29 y.o. G92P1001 Hispanic female at [redacted]w[redacted]d by LMP c/w u/s at 8 weeks with an Estimated Date of Delivery: 04/17/21 being seen today for her initial obstetrical visit.   Her obstetrical history is significant for term uncomplicated SVB x 1, baby 9lb5oz, no GDM or SD.   Today she reports n/v, phenergan doesn't work, rx'd Personal assistant, picking up today from her pharmacy.  Depression screen [facility] 2/9 10/03/2020 08/16/2016 07/30/2016  Decreased Interest 3 0 0  Down, Depressed, Hopeless 0 0 0  PHQ - 2 Score 3 0 0  Altered sleeping 0 0 -  Tired, decreased energy 3 1 -  Change in appetite 3 0 -  Feeling bad or failure about yourself  0 0 -  Trouble concentrating 2 0 -  Moving slowly or fidgety/restless 0 0 -  Suicidal thoughts 0 0 -  PHQ-9 Score 11 1 -    Patient's last menstrual period was 07/11/2020. Last pap 2017. Results were: normal Review of Systems:   Pertinent items are noted in HPI Denies cramping/contractions, leakage of fluid, vaginal bleeding, abnormal vaginal discharge w/ itching/odor/irritation, headaches, visual changes, shortness of breath, chest pain, abdominal pain, severe nausea/vomiting, or problems with urination or bowel movements unless otherwise stated above.  Pertinent History Reviewed:  Reviewed past medical,surgical, social, obstetrical and family history.  Reviewed problem list, medications and allergies. OB History  Gravida Para Term Preterm AB Living  2 1 1     1   SAB IAB Ectopic Multiple Live Births        0 1    # Outcome Date GA Lbr Len/2nd Weight Sex Delivery Anes PTL Lv  2 Current           1 Term 03/30/17 [redacted]w[redacted]d 13:40 / 02:16 9 lb 5 oz (4.224 kg) F Vag-Spont EPI  LIV   Physical Assessment:   Vitals:   10/03/20 1008  BP: 120/71  Pulse: 78  Weight: 199 lb (90.3  kg)  Body mass index is 37.6 kg/m.       Physical Examination:  General appearance - well appearing, and in no distress  Mental status - alert, oriented to person, place, and time  Psych:  She has a normal mood and affect  Skin - warm and dry, normal color, no suspicious lesions noted  Chest - effort normal, all lung fields clear to auscultation bilaterally  Heart - normal rate and regular rhythm  Abdomen - soft, nontender  Extremities:  No swelling or varicosities noted  Pelvic - VULVA: normal appearing vulva with no masses, tenderness or lesions  VAGINA: normal appearing vagina with normal color and discharge, no lesions  CERVIX: normal appearing cervix without discharge or lesions, no CMT  Thin prep pap is done w/ HR HPV cotesting  Chaperone: Latisha Cresenzo    TODAY'S NT 14/10/21 12 wks,measurements c/w dates,crl 47.43 mm,NB present,NT 1 mm,normal ovaries,fhr 167 bpm,subchorionic hemorrhage 5.9 x 3.8 x 1.6 cm  Results for orders placed or performed in visit on 10/03/20 (from the past 24 hour(s))  POC Urinalysis Dipstick OB   Collection Time: 10/03/20 10:35 AM  Result Value Ref Range   Color, UA     Clarity, UA     Glucose, UA Negative Negative   Bilirubin, UA     Ketones, UA neg  Spec Grav, UA     Blood, UA trace    pH, UA     POC,PROTEIN,UA Small (1+) Negative, Trace, Small (1+), Moderate (2+), Large (3+), 4+   Urobilinogen, UA     Nitrite, UA neg    Leukocytes, UA Negative Negative   Appearance     Odor      Assessment & Plan:  1) Low-Risk Pregnancy G2P1001 at [redacted]w[redacted]d with an Estimated Date of Delivery: 04/17/21   2) Initial OB visit  3) [facility] 5.9cm> no vb, discussed w/ pt  4) N/V> has rx for bonjesta she is picking up today, to let us know if needs anything else  Meds: No orders of the defined types were placed in this encounter.   Initial labs obtained Continue prenatal vitamins Reviewed n/v relief measures and warning s/s to report Reviewed recommended weight  gain based on pre-gravid BMI Encouraged well-balanced diet Genetic & carrier screening discussed: requests Panorama, NT/IT and Horizon 14  Ultrasound discussed; fetal survey: requested CCNC completed> form faxed if has or is planning to apply for medicaid The nature of [employer] for [employer] with multiple MDs and other Advanced Practice Providers was explained to patient; also emphasized that fellows, residents, and students are part of our team. Does not have home bp cuff. BP from office stock given. Check bp weekly, let us know if >140/90.   Indications for early A1C (per uptodate) BMI >=25 (>=23 in Asian women) AND one of the following High-risk race/ethnicity (eg, African American, Latino, Native American, [place] American, [place] Islander) Yes  Previous birth of an infant weighing ?4000 g Yes  Follow-up: Return in about 3 weeks (around 10/24/2020) for LROB, 2nd IT, in person, CNM.   Orders Placed This Encounter  Procedures  . Urine Culture  . Integrated 1  . Genetic Screening  . CBC/D/Plt+RPR+Rh+ABO+Rub Ab...  . Pain Management Screening Profile (10S)  . Hemoglobin A1c  . POC Urinalysis Dipstick OB    Cheral Marker CNM, [facility] 10/03/2020 11:00 AM

## 2020-10-03 NOTE — Patient Instructions (Signed)
Kelly Mahoney, I greatly value your feedback.  If you receive a survey following your visit with [name] today, we appreciate you taking the time to fill it out.  Thanks, [name], CNM, WHNP-BC   [facility] ([address] [place], [place] [address]) Entrance C, located off of [address] Free 24/7 valet parking   Nausea & Vomiting  Have saltine crackers or pretzels by your bed and eat a few bites before you raise your head out of bed in the morning  Eat small frequent meals throughout the day instead of large meals  Drink plenty of fluids throughout the day to stay hydrated, just don't drink a lot of fluids with your meals.  This can make your stomach fill up faster making you feel sick  Do not brush your teeth right after you eat  Products with real ginger are good for nausea, like ginger ale and ginger hard candy Make sure it says made with real ginger!  Sucking on sour candy like lemon heads is also good for nausea  If your prenatal vitamins make you nauseated, take them at night so you will sleep through the nausea  Sea Bands  If you feel like you need medicine for the nausea & vomiting please let us know  If you are unable to keep any fluids or food down please let us know   Constipation  Drink plenty of fluid, preferably water, throughout the day  Eat foods high in fiber such as fruits, vegetables, and grains  Exercise, such as walking, is a good way to keep your bowels regular  Drink warm fluids, especially warm prune juice, or decaf coffee  Eat a 1/2 cup of real oatmeal (not instant), 1/2 cup applesauce, and 1/2-1 cup warm prune juice every day  If needed, you may take Colace (docusate sodium) stool softener once or twice a day to help keep the stool soft.   If you still are having problems with constipation, you may take Miralax once daily as needed to help keep your bowels regular.   Home Blood Pressure Monitoring for Patients    Your provider has recommended that you check your blood pressure (BP) at least once a week at home. If you do not have a blood pressure cuff at home, one will be provided for you. Contact your provider if you have not received your monitor within 1 week.   Helpful Tips for Accurate Home Blood Pressure Checks  . Don't smoke, exercise, or drink caffeine 30 minutes before checking your BP . Use the restroom before checking your BP (a full bladder can raise your pressure) . Relax in a comfortable upright chair . Feet on the ground . Left arm resting comfortably on a flat surface at the level of your heart . Legs uncrossed . Back supported . Sit quietly and don't talk . Place the cuff on your bare arm . Adjust snuggly, so that only two fingertips can fit between your skin and the top of the cuff . Check 2 readings separated by at least one minute . Keep a log of your BP readings . For a visual, please reference this diagram: http://ccnc.care/bpdiagram  Provider Name: [facility]     Phone: [phone]  Zone 1: ALL CLEAR  Continue to monitor your symptoms:  . BP reading is less than 140 (top number) or less than 90 (bottom number)  . No right upper stomach pain . No headaches or seeing  spots . No feeling nauseated or throwing up . No swelling in face and hands  Zone 2: CAUTION Call your doctor's office for any of the following:  . BP reading is greater than 140 (top number) or greater than 90 (bottom number)  . Stomach pain under your ribs in the middle or right side . Headaches or seeing spots . Feeling nauseated or throwing up . Swelling in face and hands  Zone 3: EMERGENCY  Seek immediate medical care if you have any of the following:  . BP reading is greater than160 (top number) or greater than 110 (bottom number) . Severe headaches not improving with Tylenol . Serious difficulty catching your breath . Any worsening symptoms from Zone 2    First Trimester of  Pregnancy The first trimester of pregnancy is from week 1 until the end of week 12 (months 1 through 3). A week after a sperm fertilizes an egg, the egg will implant on the wall of the uterus. This embryo will begin to develop into a baby. Genes from you and your partner are forming the baby. The female genes determine whether the baby is a boy or a girl. At 6-8 weeks, the eyes and face are formed, and the heartbeat can be seen on ultrasound. At the end of 12 weeks, all the baby's organs are formed.  Now that you are pregnant, you will want to do everything you can to have a healthy baby. Two of the most important things are to get good prenatal care and to follow your health care provider's instructions. Prenatal care is all the medical care you receive before the baby's birth. This care will help prevent, find, and treat any problems during the pregnancy and childbirth. BODY CHANGES Your body goes through many changes during pregnancy. The changes vary from woman to woman.   You may gain or lose a couple of pounds at first.  You may feel sick to your stomach (nauseous) and throw up (vomit). If the vomiting is uncontrollable, call your health care provider.  You may tire easily.  You may develop headaches that can be relieved by medicines approved by your health care provider.  You may urinate more often. Painful urination may mean you have a bladder infection.  You may develop heartburn as a result of your pregnancy.  You may develop constipation because certain hormones are causing the muscles that push waste through your intestines to slow down.  You may develop hemorrhoids or swollen, bulging veins (varicose veins).  Your breasts may begin to grow larger and become tender. Your nipples may stick out more, and the tissue that surrounds them (areola) may become darker.  Your gums may bleed and may be sensitive to brushing and flossing.  Dark spots or blotches (chloasma, mask of pregnancy)  may develop on your face. This will likely fade after the baby is born.  Your menstrual periods will stop.  You may have a loss of appetite.  You may develop cravings for certain kinds of food.  You may have changes in your emotions from day to day, such as being excited to be pregnant or being concerned that something may go wrong with the pregnancy and baby.  You may have more vivid and strange dreams.  You may have changes in your hair. These can include thickening of your hair, rapid growth, and changes in texture. Some women also have hair loss during or after pregnancy, or hair that feels dry or thin. Your hair  will most likely return to normal after your baby is born. WHAT TO EXPECT AT YOUR PRENATAL VISITS During a routine prenatal visit:  You will be weighed to make sure you and the baby are growing normally.  Your blood pressure will be taken.  Your abdomen will be measured to track your baby's growth.  The fetal heartbeat will be listened to starting around week 10 or 12 of your pregnancy.  Test results from any previous visits will be discussed. Your health care provider may ask you:  How you are feeling.  If you are feeling the baby move.  If you have had any abnormal symptoms, such as leaking fluid, bleeding, severe headaches, or abdominal cramping.  If you have any questions. Other tests that may be performed during your first trimester include:  Blood tests to find your blood type and to check for the presence of any previous infections. They will also be used to check for low iron levels (anemia) and Rh antibodies. Later in the pregnancy, blood tests for diabetes will be done along with other tests if problems develop.  Urine tests to check for infections, diabetes, or protein in the urine.  An ultrasound to confirm the proper growth and development of the baby.  An amniocentesis to check for possible genetic problems.  Fetal screens for spina bifida and  Down syndrome.  You may need other tests to make sure you and the baby are doing well. HOME CARE INSTRUCTIONS  Medicines  Follow your health care provider's instructions regarding medicine use. Specific medicines may be either safe or unsafe to take during pregnancy.  Take your prenatal vitamins as directed.  If you develop constipation, try taking a stool softener if your health care provider approves. Diet  Eat regular, well-balanced meals. Choose a variety of foods, such as meat or vegetable-based protein, fish, milk and low-fat dairy products, vegetables, fruits, and whole grain breads and cereals. Your health care provider will help you determine the amount of weight gain that is right for you.  Avoid raw meat and uncooked cheese. These carry germs that can cause birth defects in the baby.  Eating four or five small meals rather than three large meals a day may help relieve nausea and vomiting. If you start to feel nauseous, eating a few soda crackers can be helpful. Drinking liquids between meals instead of during meals also seems to help nausea and vomiting.  If you develop constipation, eat more high-fiber foods, such as fresh vegetables or fruit and whole grains. Drink enough fluids to keep your urine clear or pale yellow. Activity and Exercise  Exercise only as directed by your health care provider. Exercising will help you:  Control your weight.  Stay in shape.  Be prepared for labor and delivery.  Experiencing pain or cramping in the lower abdomen or low back is a good sign that you should stop exercising. Check with your health care provider before continuing normal exercises.  Try to avoid standing for long periods of time. Move your legs often if you must stand in one place for a long time.  Avoid heavy lifting.  Wear low-heeled shoes, and practice good posture.  You may continue to have sex unless your health care provider directs you otherwise. Relief of Pain  or Discomfort  Wear a good support bra for breast tenderness.    Take warm sitz baths to soothe any pain or discomfort caused by hemorrhoids. Use hemorrhoid cream if your health care provider approves.  Rest with your legs elevated if you have leg cramps or low back pain.  If you develop varicose veins in your legs, wear support hose. Elevate your feet for 15 minutes, 3-4 times a day. Limit salt in your diet. Prenatal Care  Schedule your prenatal visits by the twelfth week of pregnancy. They are usually scheduled monthly at first, then more often in the last 2 months before delivery.  Write down your questions. Take them to your prenatal visits.  Keep all your prenatal visits as directed by your health care provider. Safety  Wear your seat belt at all times when driving.  Make a list of emergency phone numbers, including numbers for family, friends, the hospital, and police and fire departments. General Tips  Ask your health care provider for a referral to a local prenatal education class. Begin classes no later than at the beginning of month 6 of your pregnancy.  Ask for help if you have counseling or nutritional needs during pregnancy. Your health care provider can offer advice or refer you to specialists for help with various needs.  Do not use hot tubs, steam rooms, or saunas.  Do not douche or use tampons or scented sanitary pads.  Do not cross your legs for long periods of time.  Avoid cat litter boxes and soil used by cats. These carry germs that can cause birth defects in the baby and possibly loss of the fetus by miscarriage or stillbirth.  Avoid all smoking, herbs, alcohol, and medicines not prescribed by your health care provider. Chemicals in these affect the formation and growth of the baby.  Schedule a dentist appointment. At home, brush your teeth with a soft toothbrush and be gentle when you floss. SEEK MEDICAL CARE IF:   You have dizziness.  You have mild  pelvic cramps, pelvic pressure, or nagging pain in the abdominal area.  You have persistent nausea, vomiting, or diarrhea.  You have a bad smelling vaginal discharge.  You have pain with urination.  You notice increased swelling in your face, hands, legs, or ankles. SEEK IMMEDIATE MEDICAL CARE IF:   You have a fever.  You are leaking fluid from your vagina.  You have spotting or bleeding from your vagina.  You have severe abdominal cramping or pain.  You have rapid weight gain or loss.  You vomit blood or material that looks like coffee grounds.  You are exposed to [name] measles and have never had them.  You are exposed to fifth disease or chickenpox.  You develop a severe headache.  You have shortness of breath.  You have any kind of trauma, such as from a fall or a car accident. Document Released: 10/05/2001 Document Revised: 02/25/2014 Document Reviewed: 08/21/2013 [facility] Patient Information 2015 [place], [place]. This information is not intended to replace advice given to you by your health care provider. Make sure you discuss any questions you have with your health care provider.

## 2020-10-03 NOTE — Progress Notes (Signed)
[name] 12 wks,measurements c/w dates,crl 47.43 mm,NB present,NT 1 mm,normal ovaries,fhr 167 bpm,subchorionic hemorrhage 5.9 x 3.8 x 1.6 cm

## 2020-10-06 LAB — CYTOLOGY - PAP
Chlamydia: NEGATIVE
Comment: NEGATIVE
Comment: NEGATIVE
Comment: NORMAL
Diagnosis: NEGATIVE
Diagnosis: REACTIVE
High risk HPV: NEGATIVE
Neisseria Gonorrhea: NEGATIVE

## 2020-10-07 LAB — PMP SCREEN PROFILE (10S), URINE
Amphetamine Scrn, Ur: NEGATIVE ng/mL
BARBITURATE SCREEN URINE: NEGATIVE ng/mL
BENZODIAZEPINE SCREEN, URINE: NEGATIVE ng/mL
CANNABINOIDS UR QL SCN: NEGATIVE ng/mL
Cocaine (Metab) Scrn, Ur: NEGATIVE ng/mL
Creatinine(Crt), U: 188.3 mg/dL (ref 20.0–300.0)
Methadone Screen, Urine: NEGATIVE ng/mL
OXYCODONE+OXYMORPHONE UR QL SCN: NEGATIVE ng/mL
Opiate Scrn, Ur: NEGATIVE ng/mL
Ph of Urine: 7.6 (ref 4.5–8.9)
Phencyclidine Qn, Ur: NEGATIVE ng/mL
Propoxyphene Scrn, Ur: NEGATIVE ng/mL

## 2020-10-07 LAB — CBC/D/PLT+RPR+RH+ABO+RUB AB...
Antibody Screen: NEGATIVE
Basophils Absolute: 0 10*3/uL (ref 0.0–0.2)
Basos: 0 %
EOS (ABSOLUTE): 0 10*3/uL (ref 0.0–0.4)
Eos: 0 %
HCV Ab: <0.1 s/co ratio (ref 0.0–0.9)
HIV Screen 4th Generation wRfx: NONREACTIVE
Hematocrit: 40.1 % (ref 34.0–46.6)
Hemoglobin: 13.7 g/dL (ref 11.1–15.9)
Hepatitis B Surface Ag: NEGATIVE
Immature Grans (Abs): 0 10*3/uL (ref 0.0–0.1)
Immature Granulocytes: 0 %
Lymphocytes Absolute: 1.4 10*3/uL (ref 0.7–3.1)
Lymphs: 16 %
MCH: 29.7 pg (ref 26.6–33.0)
MCHC: 34.2 g/dL (ref 31.5–35.7)
MCV: 87 fL (ref 79–97)
Monocytes Absolute: 0.4 10*3/uL (ref 0.1–0.9)
Monocytes: 5 %
Neutrophils Absolute: 7.2 10*3/uL — ABNORMAL HIGH (ref 1.4–7.0)
Neutrophils: 79 %
Platelets: 278 10*3/uL (ref 150–450)
RBC: 4.61 x10E6/uL (ref 3.77–5.28)
RDW: 12.9 % (ref 11.7–15.4)
RPR Ser Ql: NONREACTIVE
Rh Factor: POSITIVE
Rubella Antibodies, IGG: 1.46 index (ref >0.99)
WBC: 9.2 10*3/uL (ref 3.4–10.8)

## 2020-10-07 LAB — INTEGRATED 1
Crown Rump Length: 47.4 mm
Gest. Age on Collection Date: 11.3 weeks
Maternal Age at EDD: 29.4 yr
Nuchal Translucency (NT): 1 mm
Number of Fetuses: 1
PAPP-A Value: 769.9 ng/mL
Weight: 199 [lb_av]

## 2020-10-07 LAB — HCV INTERPRETATION

## 2020-10-08 LAB — URINE CULTURE

## 2020-10-13 ENCOUNTER — Other Ambulatory Visit: Payer: Self-pay | Admitting: Women's Health

## 2020-10-13 DIAGNOSIS — O99891 Other specified diseases and conditions complicating pregnancy: Secondary | ICD-10-CM

## 2020-10-13 DIAGNOSIS — R8271 Bacteriuria: Secondary | ICD-10-CM

## 2020-10-13 MED ORDER — NITROFURANTOIN MONOHYD MACRO 100 MG PO CAPS
100.0000 mg | ORAL_CAPSULE | Freq: Two times a day (BID) | ORAL | 0 refills | Status: DC
Start: 2020-10-13 — End: 2020-10-23

## 2020-10-21 ENCOUNTER — Encounter: Payer: Self-pay | Admitting: *Deleted

## 2020-10-21 DIAGNOSIS — Z3480 Encounter for supervision of other normal pregnancy, unspecified trimester: Secondary | ICD-10-CM

## 2020-10-23 ENCOUNTER — Ambulatory Visit (INDEPENDENT_AMBULATORY_CARE_PROVIDER_SITE_OTHER): Payer: Managed Care, Other (non HMO) | Admitting: Advanced Practice Midwife

## 2020-10-23 ENCOUNTER — Other Ambulatory Visit: Payer: Self-pay

## 2020-10-23 VITALS — BP 110/73 | HR 93 | Wt 203.5 lb

## 2020-10-23 DIAGNOSIS — Z1389 Encounter for screening for other disorder: Secondary | ICD-10-CM

## 2020-10-23 DIAGNOSIS — Z3A15 15 weeks gestation of pregnancy: Secondary | ICD-10-CM

## 2020-10-23 DIAGNOSIS — Z331 Pregnant state, incidental: Secondary | ICD-10-CM

## 2020-10-23 DIAGNOSIS — Z363 Encounter for antenatal screening for malformations: Secondary | ICD-10-CM

## 2020-10-23 LAB — POCT URINALYSIS DIPSTICK OB
Blood, UA: NEGATIVE
Glucose, UA: NEGATIVE
Ketones, UA: NEGATIVE
Leukocytes, UA: NEGATIVE
Nitrite, UA: NEGATIVE
POC,PROTEIN,UA: NEGATIVE

## 2020-10-23 MED ORDER — PRENATE MINI 18-0.6-0.4-350 MG PO CAPS: 1.0000 | ORAL_CAPSULE | Freq: Every day | ORAL | 11 refills | Status: AC

## 2020-10-23 MED ORDER — SULFAMETHOXAZOLE-TRIMETHOPRIM 800-160 MG PO TABS: 1.0000 | ORAL_TABLET | Freq: Two times a day (BID) | ORAL | 0 refills | Status: DC

## 2020-10-23 NOTE — Progress Notes (Signed)
   LOW-RISK PREGNANCY VISIT Patient name: Kelly Mahoney MRN [id number]  Date of birth: 02-Jun-1991 Chief Complaint:   Routine Prenatal Visit  History of Present Illness:   Kelly Mahoney is a 29 y.o. G60P1001 female at [redacted]w[redacted]d with an Estimated Date of Delivery: 04/17/21 being seen today for ongoing management of a low-risk pregnancy.  Today she reports some dysuria.  Still taking bonjesta, helps. . Contractions: Not present. Vag. Bleeding: None.  Movement: Present. denies leaking of fluid. Review of Systems:   Pertinent items are noted in HPI Denies abnormal vaginal discharge w/ itching/odor/irritation, headaches, visual changes, shortness of breath, chest pain, abdominal pain, severe nausea/vomiting, or problems with urination or bowel movements unless otherwise stated above. Pertinent History Reviewed:  Reviewed past medical,surgical, social, obstetrical and family history.  Reviewed problem list, medications and allergies. Physical Assessment:   Vitals:   10/23/20 1403  BP: 110/73  Pulse: 93  Weight: 203 lb 8 oz (92.3 kg)  Body mass index is 38.45 kg/m.        Physical Examination:   General appearance: Well appearing, and in no distress  Mental status: Alert, oriented to person, place, and time  Skin: Warm & dry  Cardiovascular: Normal heart rate noted  Respiratory: Normal respiratory effort, no distress  Abdomen: Soft, gravid, nontender  Pelvic: Cervical exam deferred         Extremities: Edema: None  Fetal Status:     Movement: Present    Chaperone: n/a    Results for orders placed or performed in visit on 10/23/20 (from the past 24 hour(s))  POC Urinalysis Dipstick OB   Collection Time: 10/23/20  2:01 PM  Result Value Ref Range   Color, UA     Clarity, UA     Glucose, UA Negative Negative   Bilirubin, UA     Ketones, UA neg    Spec Grav, UA     Blood, UA neg    pH, UA     POC,PROTEIN,UA Negative Negative, Trace, Small (1+), Moderate (2+), Large (3+), 4+    Urobilinogen, UA     Nitrite, UA neg    Leukocytes, UA Negative Negative   Appearance     Odor      Assessment & Plan:  1) Low-risk pregnancy G2P1001 at [redacted]w[redacted]d with an Estimated Date of Delivery: 04/17/21   2) UTI, now having SX (dysruia at end of void),  Will re culture and start Bactrim   Meds: No orders of the defined types were placed in this encounter.  Labs/procedures today: urine cx  Plan:  Continue routine obstetrical care  Next visit: prefers in person    Reviewed: Preterm labor symptoms and general obstetric precautions including but not limited to vaginal bleeding, contractions, leaking of fluid and fetal movement were reviewed in detail with the patient.  All questions were answered. Has home bp cuff. Check bp weekly, let us know if >140/90.   Follow-up: No follow-ups on file.  Orders Placed This Encounter  Procedures  . Urine Culture  . POC Urinalysis Dipstick OB   Jacklyn Shell DNP, CNM 10/23/2020 2:14 PM

## 2020-10-23 NOTE — Patient Instructions (Signed)
Kelly Mahoney, I greatly value your feedback.  If you receive a survey following your visit with [name] today, we appreciate you taking the time to fill it out.  Thanks, Cathie Beams, CNM     [facility] HAS MOVED!!! It is now [facility] ([address] [place], [place] [address]) Entrance located off of [address] Free 24/7 valet parking   Go to [email or web address] to register for FREE online childbirth classes    Second Trimester of Pregnancy The second trimester is from week 14 through week 27 (months 4 through 6). The second trimester is often a time when you feel your best. Your body has adjusted to being pregnant, and you begin to feel better physically. Usually, morning sickness has lessened or quit completely, you may have more energy, and you may have an increase in appetite. The second trimester is also a time when the fetus is growing rapidly. At the end of the sixth month, the fetus is about 9 inches long and weighs about 1 pounds. You will likely begin to feel the baby move (quickening) between 16 and 20 weeks of pregnancy. Body changes during your second trimester Your body continues to go through many changes during your second trimester. The changes vary from woman to woman.  Your weight will continue to increase. You will notice your lower abdomen bulging out.  You may begin to get stretch marks on your hips, abdomen, and breasts.  You may develop headaches that can be relieved by medicines. The medicines should be approved by your health care provider.  You may urinate more often because the fetus is pressing on your bladder.  You may develop or continue to have heartburn as a result of your pregnancy.  You may develop constipation because certain hormones are causing the muscles that push waste through your intestines to slow down.  You may develop hemorrhoids or swollen, bulging veins (varicose veins).  You may have back  pain. This is caused by: ? Weight gain. ? Pregnancy hormones that are relaxing the joints in your pelvis. ? A shift in weight and the muscles that support your balance.  Your breasts will continue to grow and they will continue to become tender.  Your gums may bleed and may be sensitive to brushing and flossing.  Dark spots or blotches (chloasma, mask of pregnancy) may develop on your face. This will likely fade after the baby is born.  A dark line from your belly button to the pubic area (linea nigra) may appear. This will likely fade after the baby is born.  You may have changes in your hair. These can include thickening of your hair, rapid growth, and changes in texture. Some women also have hair loss during or after pregnancy, or hair that feels dry or thin. Your hair will most likely return to normal after your baby is born.  What to expect at prenatal visits During a routine prenatal visit:  You will be weighed to make sure you and the fetus are growing normally.  Your blood pressure will be taken.  Your abdomen will be measured to track your baby's growth.  The fetal heartbeat will be listened to.  Any test results from the previous visit will be discussed.  Your health care provider may ask you:  How you are feeling.  If you are feeling the baby move.  If you have had any abnormal symptoms, such as leaking fluid, bleeding, severe headaches, or abdominal  cramping.  If you are using any tobacco products, including cigarettes, chewing tobacco, and electronic cigarettes.  If you have any questions.  Other tests that may be performed during your second trimester include:  Blood tests that check for: ? Low iron levels (anemia). ? High blood sugar that affects pregnant women (gestational diabetes) between 65 and 28 weeks. ? Rh antibodies. This is to check for a protein on red blood cells (Rh factor).  Urine tests to check for infections, diabetes, or protein in the  urine.  An ultrasound to confirm the proper growth and development of the baby.  An amniocentesis to check for possible genetic problems.  Fetal screens for spina bifida and Down syndrome.  HIV (human immunodeficiency virus) testing. Routine prenatal testing includes screening for HIV, unless you choose not to have this test.  Follow these instructions at home: Medicines  Follow your health care provider's instructions regarding medicine use. Specific medicines may be either safe or unsafe to take during pregnancy.  Take a prenatal vitamin that contains at least 600 micrograms (mcg) of folic acid.  If you develop constipation, try taking a stool softener if your health care provider approves. Eating and drinking  Eat a balanced diet that includes fresh fruits and vegetables, whole grains, good sources of protein such as meat, eggs, or tofu, and low-fat dairy. Your health care provider will help you determine the amount of weight gain that is right for you.  Avoid raw meat and uncooked cheese. These carry germs that can cause birth defects in the baby.  If you have low calcium intake from food, talk to your health care provider about whether you should take a daily calcium supplement.  Limit foods that are high in fat and processed sugars, such as fried and sweet foods.  To prevent constipation: ? Drink enough fluid to keep your urine clear or pale yellow. ? Eat foods that are high in fiber, such as fresh fruits and vegetables, whole grains, and beans. Activity  Exercise only as directed by your health care provider. Most women can continue their usual exercise routine during pregnancy. Try to exercise for 30 minutes at least 5 days a week. Stop exercising if you experience uterine contractions.  Avoid heavy lifting, wear low heel shoes, and practice good posture.  A sexual relationship may be continued unless your health care provider directs you otherwise. Relieving pain and  discomfort  Wear a good support bra to prevent discomfort from breast tenderness.  Take warm sitz baths to soothe any pain or discomfort caused by hemorrhoids. Use hemorrhoid cream if your health care provider approves.  Rest with your legs elevated if you have leg cramps or low back pain.  If you develop varicose veins, wear support hose. Elevate your feet for 15 minutes, 3-4 times a day. Limit salt in your diet. Prenatal Care  Write down your questions. Take them to your prenatal visits.  Keep all your prenatal visits as told by your health care provider. This is important. Safety  Wear your seat belt at all times when driving.  Make a list of emergency phone numbers, including numbers for family, friends, the hospital, and police and fire departments. General instructions  Ask your health care provider for a referral to a local prenatal education class. Begin classes no later than the beginning of month 6 of your pregnancy.  Ask for help if you have counseling or nutritional needs during pregnancy. Your health care provider can offer advice or refer  you to specialists for help with various needs.  Do not use hot tubs, steam rooms, or saunas.  Do not douche or use tampons or scented sanitary pads.  Do not cross your legs for long periods of time.  Avoid cat litter boxes and soil used by cats. These carry germs that can cause birth defects in the baby and possibly loss of the fetus by miscarriage or stillbirth.  Avoid all smoking, herbs, alcohol, and unprescribed drugs. Chemicals in these products can affect the formation and growth of the baby.  Do not use any products that contain nicotine or tobacco, such as cigarettes and e-cigarettes. If you need help quitting, ask your health care provider.  Visit your dentist if you have not gone yet during your pregnancy. Use a soft toothbrush to brush your teeth and be gentle when you floss. Contact a health care provider if:  You  have dizziness.  You have mild pelvic cramps, pelvic pressure, or nagging pain in the abdominal area.  You have persistent nausea, vomiting, or diarrhea.  You have a bad smelling vaginal discharge.  You have pain when you urinate. Get help right away if:  You have a fever.  You are leaking fluid from your vagina.  You have spotting or bleeding from your vagina.  You have severe abdominal cramping or pain.  You have rapid weight gain or weight loss.  You have shortness of breath with chest pain.  You notice sudden or extreme swelling of your face, hands, ankles, feet, or legs.  You have not felt your baby move in over an hour.  You have severe headaches that do not go away when you take medicine.  You have vision changes. Summary  The second trimester is from week 14 through week 27 (months 4 through 6). It is also a time when the fetus is growing rapidly.  Your body goes through many changes during pregnancy. The changes vary from woman to woman.  Avoid all smoking, herbs, alcohol, and unprescribed drugs. These chemicals affect the formation and growth your baby.  Do not use any tobacco products, such as cigarettes, chewing tobacco, and e-cigarettes. If you need help quitting, ask your health care provider.  Contact your health care provider if you have any questions. Keep all prenatal visits as told by your health care provider. This is important. This information is not intended to replace advice given to you by your health care provider. Make sure you discuss any questions you have with your health care provider.

## 2020-10-25 LAB — URINE CULTURE

## 2020-11-27 ENCOUNTER — Ambulatory Visit (INDEPENDENT_AMBULATORY_CARE_PROVIDER_SITE_OTHER): Payer: Managed Care, Other (non HMO)

## 2020-11-27 ENCOUNTER — Encounter: Payer: Self-pay | Admitting: Women's Health

## 2020-11-27 ENCOUNTER — Other Ambulatory Visit: Payer: Self-pay

## 2020-11-27 ENCOUNTER — Ambulatory Visit (INDEPENDENT_AMBULATORY_CARE_PROVIDER_SITE_OTHER): Payer: Managed Care, Other (non HMO) | Admitting: Women's Health

## 2020-11-27 VITALS — BP 113/70 | HR 80 | Wt 210.8 lb

## 2020-11-27 DIAGNOSIS — Z3480 Encounter for supervision of other normal pregnancy, unspecified trimester: Secondary | ICD-10-CM

## 2020-11-27 DIAGNOSIS — Z3A19 19 weeks gestation of pregnancy: Secondary | ICD-10-CM | POA: Diagnosis not present

## 2020-11-27 DIAGNOSIS — O365990 Maternal care for other known or suspected poor fetal growth, unspecified trimester, not applicable or unspecified: Secondary | ICD-10-CM

## 2020-11-27 DIAGNOSIS — Z363 Encounter for antenatal screening for malformations: Secondary | ICD-10-CM | POA: Diagnosis not present

## 2020-11-27 DIAGNOSIS — R8271 Bacteriuria: Secondary | ICD-10-CM

## 2020-11-27 DIAGNOSIS — Z3492 Encounter for supervision of normal pregnancy, unspecified, second trimester: Secondary | ICD-10-CM

## 2020-11-27 DIAGNOSIS — O99891 Other specified diseases and conditions complicating pregnancy: Secondary | ICD-10-CM

## 2020-11-27 NOTE — Patient Instructions (Signed)
Kelly Mahoney, I greatly value your feedback.  If you receive a survey following your visit with [name] today, we appreciate you taking the time to fill it out.  Thanks, [name], CNM, WHNP-BC  [facility] ([address] [place], [place] [address]) Entrance C, located off of [address] valet parking  Go to [email or web address] to register for FREE online childbirth classes   Pediatricians/Family Doctors:  [facility] [phone]            [facility] [phone]                 [facility] [phone] (usually not accepting new patients unless you have family there already, you are always welcome to call and ask)       [facility] Department [phone]       [facility] Pediatricians/Family Doctors:   [facility]: [phone]  [facility]: [phone]  [facility]: [phone]  [facility] Doctors:   [facility]: [phone]   [facility]: [phone]  [facility] Doctors:  [facility]: [phone]    Home Blood Pressure Monitoring for Patients   Your provider has recommended that you check your blood pressure (BP) at least once a week at home. If you do not have a blood pressure cuff at home, one will be provided for you. Contact your provider if you have not received your monitor within 1 week.   Helpful Tips for Accurate Home Blood Pressure Checks  . Don't smoke, exercise, or drink caffeine 30 minutes before checking your BP . Use the restroom before checking your BP (a full bladder can raise your pressure) . Relax in a comfortable upright chair . Feet on the ground . Left arm resting comfortably on a flat surface at the level of your heart . Legs uncrossed . Back supported . Sit quietly and don't talk . Place the cuff on your bare arm . Adjust snuggly, so  that only two fingertips can fit between your skin and the top of the cuff . Check 2 readings separated by at least one minute . Keep a log of your BP readings . For a visual, please reference this diagram: http://ccnc.care/bpdiagram  Provider Name: [facility]     Phone: [phone]  Zone 1: ALL CLEAR  Continue to monitor your symptoms:  . BP reading is less than 140 (top number) or less than 90 (bottom number)  . No right upper stomach pain . No headaches or seeing spots . No feeling nauseated or throwing up . No swelling in face and hands  Zone 2: CAUTION Call your doctor's office for any of the following:  . BP reading is greater than 140 (top number) or greater than 90 (bottom number)  . Stomach pain under your ribs in the middle or right side . Headaches or seeing spots . Feeling nauseated or throwing up . Swelling in face and hands  Zone 3: EMERGENCY  Seek immediate medical care if you have any of the following:  . BP reading is greater than160 (top number) or greater than 110 (bottom number) . Severe headaches not improving with Tylenol . Serious difficulty catching your breath . Any worsening symptoms from Zone 2     Second Trimester of Pregnancy The second trimester is from week 14 through week 27 (months 4 through 6). The second trimester is often a time when you feel your best.  Your body has adjusted to being pregnant, and you begin to feel better physically. Usually, morning sickness has lessened or quit completely, you may have more energy, and you may have an increase in appetite. The second trimester is also a time when the fetus is growing rapidly. At the end of the sixth month, the fetus is about 9 inches long and weighs about 1 pounds. You will likely begin to feel the baby move (quickening) between 16 and 20 weeks of pregnancy. Body changes during your second trimester Your body continues to go through many changes during your second trimester. The  changes vary from woman to woman.  Your weight will continue to increase. You will notice your lower abdomen bulging out.  You may begin to get stretch marks on your hips, abdomen, and breasts.  You may develop headaches that can be relieved by medicines. The medicines should be approved by your health care provider.  You may urinate more often because the fetus is pressing on your bladder.  You may develop or continue to have heartburn as a result of your pregnancy.  You may develop constipation because certain hormones are causing the muscles that push waste through your intestines to slow down.  You may develop hemorrhoids or swollen, bulging veins (varicose veins).  You may have back pain. This is caused by: ? Weight gain. ? Pregnancy hormones that are relaxing the joints in your pelvis. ? A shift in weight and the muscles that support your balance.  Your breasts will continue to grow and they will continue to become tender.  Your gums may bleed and may be sensitive to brushing and flossing.  Dark spots or blotches (chloasma, mask of pregnancy) may develop on your face. This will likely fade after the baby is born.  A dark line from your belly button to the pubic area (linea nigra) may appear. This will likely fade after the baby is born.  You may have changes in your hair. These can include thickening of your hair, rapid growth, and changes in texture. Some women also have hair loss during or after pregnancy, or hair that feels dry or thin. Your hair will most likely return to normal after your baby is born.  What to expect at prenatal visits During a routine prenatal visit:  You will be weighed to make sure you and the fetus are growing normally.  Your blood pressure will be taken.  Your abdomen will be measured to track your baby's growth.  The fetal heartbeat will be listened to.  Any test results from the previous visit will be discussed.  Your health care  provider may ask you:  How you are feeling.  If you are feeling the baby move.  If you have had any abnormal symptoms, such as leaking fluid, bleeding, severe headaches, or abdominal cramping.  If you are using any tobacco products, including cigarettes, chewing tobacco, and electronic cigarettes.  If you have any questions.  Other tests that may be performed during your second trimester include:  Blood tests that check for: ? Low iron levels (anemia). ? High blood sugar that affects pregnant women (gestational diabetes) between 64 and 28 weeks. ? Rh antibodies. This is to check for a protein on red blood cells (Rh factor).  Urine tests to check for infections, diabetes, or protein in the urine.  An ultrasound to confirm the proper growth and development of the baby.  An amniocentesis to check for possible genetic problems.  Fetal screens  for spina bifida and Down syndrome.  HIV (human immunodeficiency virus) testing. Routine prenatal testing includes screening for HIV, unless you choose not to have this test.  Follow these instructions at home: Medicines  Follow your health care provider's instructions regarding medicine use. Specific medicines may be either safe or unsafe to take during pregnancy.  Take a prenatal vitamin that contains at least 600 micrograms (mcg) of folic acid.  If you develop constipation, try taking a stool softener if your health care provider approves. Eating and drinking  Eat a balanced diet that includes fresh fruits and vegetables, whole grains, good sources of protein such as meat, eggs, or tofu, and low-fat dairy. Your health care provider will help you determine the amount of weight gain that is right for you.  Avoid raw meat and uncooked cheese. These carry germs that can cause birth defects in the baby.  If you have low calcium intake from food, talk to your health care provider about whether you should take a daily calcium  supplement.  Limit foods that are high in fat and processed sugars, such as fried and sweet foods.  To prevent constipation: ? Drink enough fluid to keep your urine clear or pale yellow. ? Eat foods that are high in fiber, such as fresh fruits and vegetables, whole grains, and beans. Activity  Exercise only as directed by your health care provider. Most women can continue their usual exercise routine during pregnancy. Try to exercise for 30 minutes at least 5 days a week. Stop exercising if you experience uterine contractions.  Avoid heavy lifting, wear low heel shoes, and practice good posture.  A sexual relationship may be continued unless your health care provider directs you otherwise. Relieving pain and discomfort  Wear a good support bra to prevent discomfort from breast tenderness.  Take warm sitz baths to soothe any pain or discomfort caused by hemorrhoids. Use hemorrhoid cream if your health care provider approves.  Rest with your legs elevated if you have leg cramps or low back pain.  If you develop varicose veins, wear support hose. Elevate your feet for 15 minutes, 3-4 times a day. Limit salt in your diet. Prenatal Care  Write down your questions. Take them to your prenatal visits.  Keep all your prenatal visits as told by your health care provider. This is important. Safety  Wear your seat belt at all times when driving.  Make a list of emergency phone numbers, including numbers for family, friends, the hospital, and police and fire departments. General instructions  Ask your health care provider for a referral to a local prenatal education class. Begin classes no later than the beginning of month 6 of your pregnancy.  Ask for help if you have counseling or nutritional needs during pregnancy. Your health care provider can offer advice or refer you to specialists for help with various needs.  Do not use hot tubs, steam rooms, or saunas.  Do not douche or use  tampons or scented sanitary pads.  Do not cross your legs for long periods of time.  Avoid cat litter boxes and soil used by cats. These carry germs that can cause birth defects in the baby and possibly loss of the fetus by miscarriage or stillbirth.  Avoid all smoking, herbs, alcohol, and unprescribed drugs. Chemicals in these products can affect the formation and growth of the baby.  Do not use any products that contain nicotine or tobacco, such as cigarettes and e-cigarettes. If you need help quitting,  ask your health care provider.  Visit your dentist if you have not gone yet during your pregnancy. Use a soft toothbrush to brush your teeth and be gentle when you floss. Contact a health care provider if:  You have dizziness.  You have mild pelvic cramps, pelvic pressure, or nagging pain in the abdominal area.  You have persistent nausea, vomiting, or diarrhea.  You have a bad smelling vaginal discharge.  You have pain when you urinate. Get help right away if:  You have a fever.  You are leaking fluid from your vagina.  You have spotting or bleeding from your vagina.  You have severe abdominal cramping or pain.  You have rapid weight gain or weight loss.  You have shortness of breath with chest pain.  You notice sudden or extreme swelling of your face, hands, ankles, feet, or legs.  You have not felt your baby move in over an hour.  You have severe headaches that do not go away when you take medicine.  You have vision changes. Summary  The second trimester is from week 14 through week 27 (months 4 through 6). It is also a time when the fetus is growing rapidly.  Your body goes through many changes during pregnancy. The changes vary from woman to woman.  Avoid all smoking, herbs, alcohol, and unprescribed drugs. These chemicals affect the formation and growth your baby.  Do not use any tobacco products, such as cigarettes, chewing tobacco, and e-cigarettes. If you  need help quitting, ask your health care provider.  Contact your health care provider if you have any questions. Keep all prenatal visits as told by your health care provider. This is important. This information is not intended to replace advice given to you by your health care provider. Make sure you discuss any questions you have with your health care provider. Document Released: 10/05/2001 Document Revised: 03/18/2016 Document Reviewed: 12/12/2012 Elsevier Interactive Patient Education  2017 [employer].

## 2020-11-27 NOTE — Progress Notes (Signed)
[name] 19+6 wks,cephalic,cx 4.9 cm,svp of fluid 4.9 cm,left lateral placenta gr 0,normal ovaries,fhr 150 bpm,EFW 272 g 12%,anatomy complete,no obvious abnormalities

## 2020-11-27 NOTE — Progress Notes (Signed)
   LOW-RISK PREGNANCY VISIT Patient name: Kelly Mahoney MRN [id number]  Date of birth: 1990/11/27 Chief Complaint:   Routine Prenatal Visit  History of Present Illness:   Kelly Mahoney is a 30 y.o. G42P1001 female at [redacted]w[redacted]d with an Estimated Date of Delivery: 04/17/21 being seen today for ongoing management of a low-risk pregnancy.  Depression screen [facility] 2/9 10/03/2020 08/16/2016 07/30/2016  Decreased Interest 3 0 0  Down, Depressed, Hopeless 0 0 0  PHQ - 2 Score 3 0 0  Altered sleeping 0 0 -  Tired, decreased energy 3 1 -  Change in appetite 3 0 -  Feeling bad or failure about yourself  0 0 -  Trouble concentrating 2 0 -  Moving slowly or fidgety/restless 0 0 -  Suicidal thoughts 0 0 -  PHQ-9 Score 11 1 -    Today she reports no complaints. Contractions: Not present. Vag. Bleeding: None.  Movement: Present. denies leaking of fluid. Review of Systems:   Pertinent items are noted in HPI Denies abnormal vaginal discharge w/ itching/odor/irritation, headaches, visual changes, shortness of breath, chest pain, abdominal pain, severe nausea/vomiting, or problems with urination or bowel movements unless otherwise stated above. Pertinent History Reviewed:  Reviewed past medical,surgical, social, obstetrical and family history.  Reviewed problem list, medications and allergies. Physical Assessment:   Vitals:   11/27/20 1004  BP: 113/70  Pulse: 80  Weight: 210 lb 12.8 oz (95.6 kg)  Body mass index is 39.83 kg/m.        Physical Examination:   General appearance: Well appearing, and in no distress  Mental status: Alert, oriented to person, place, and time  Skin: Warm & dry  Cardiovascular: Normal heart rate noted  Respiratory: Normal respiratory effort, no distress  Abdomen: Soft, gravid, nontender  Pelvic: Cervical exam deferred         Extremities: Edema: None  Fetal Status: Fetal Heart Rate (bpm): 150 u/s   Movement: Present   [name] 19+6 wks,cephalic,cx 4.9 cm,svp of fluid 4.9 cm,left  lateral placenta gr 0,normal ovaries,fhr 150 bpm,EFW 272 g 12%,anatomy complete,no obvious abnormalities   Chaperone: N/A   No results found for this or any previous visit (from the past 24 hour(s)).  Assessment & Plan:  1) Low-risk pregnancy G2P1001 at [redacted]w[redacted]d with an Estimated Date of Delivery: 04/17/21   2) Borderline SGA, 12% today, will repeat next visit  3) ASB 1st trimester> urine cx poc today   Meds: No orders of the defined types were placed in this encounter.  Labs/procedures today: u/s, 2nd IT, A1C, urine cx  Plan:  Continue routine obstetrical care  Next visit: prefers will be in person for u/s    Reviewed: Preterm labor symptoms and general obstetric precautions including but not limited to vaginal bleeding, contractions, leaking of fluid and fetal movement were reviewed in detail with the patient.  All questions were answered. Has home bp cuff. Check bp weekly, let us know if >140/90.   Follow-up: Return in about 4 weeks (around 12/25/2020) for LROB, US:EFW, in person, CNM.  No future appointments.  Orders Placed This Encounter  Procedures  . Urine Culture  . US OB Follow Up  . INTEGRATED 2  . HgB A1c   Cheral Marker CNM, [facility] 11/27/2020 10:40 AM

## 2020-11-28 LAB — INTEGRATED 2

## 2020-11-28 LAB — HEMOGLOBIN A1C: Hgb A1c MFr Bld: 4.6 % — ABNORMAL LOW (ref 4.8–5.6)

## 2020-11-29 LAB — INTEGRATED 2
Alpha-Fetoprotein: 54.2 ng/mL
Crown Rump Length: 47.4 mm
DIA MoM: 1.1
DIA Value: 153.2 pg/mL
Estriol, Unconjugated: 1.96 ng/mL
Gest. Age on Collection Date: 11.3 weeks
Gestational Age: 19.1 weeks
Maternal Age at EDD: 29.4 yr
Nuchal Translucency (NT): 1 mm
Nuchal Translucency MoM: 0.93
Number of Fetuses: 1
PAPP-A MoM: 1.74
PAPP-A Value: 769.9 ng/mL
Weight: 199 [lb_av]
hCG MoM: 1.21
hCG Value: 22.6 IU/mL
uE3 MoM: 1.17

## 2020-11-29 LAB — HEMOGLOBIN A1C: Est. average glucose Bld gHb Est-mCnc: 85 mg/dL

## 2020-11-29 LAB — URINE CULTURE

## 2020-12-01 ENCOUNTER — Encounter: Payer: Self-pay | Admitting: Women's Health

## 2020-12-01 ENCOUNTER — Other Ambulatory Visit: Payer: Self-pay | Admitting: Women's Health

## 2020-12-01 DIAGNOSIS — R8271 Bacteriuria: Secondary | ICD-10-CM

## 2020-12-01 DIAGNOSIS — Z3480 Encounter for supervision of other normal pregnancy, unspecified trimester: Secondary | ICD-10-CM

## 2020-12-01 MED ORDER — AMOXICILLIN 500 MG PO CAPS: 500.0000 mg | ORAL_CAPSULE | Freq: Two times a day (BID) | ORAL | 0 refills | Status: DC

## 2020-12-25 ENCOUNTER — Other Ambulatory Visit: Payer: Self-pay

## 2020-12-25 ENCOUNTER — Ambulatory Visit (INDEPENDENT_AMBULATORY_CARE_PROVIDER_SITE_OTHER): Payer: Managed Care, Other (non HMO)

## 2020-12-25 ENCOUNTER — Ambulatory Visit (INDEPENDENT_AMBULATORY_CARE_PROVIDER_SITE_OTHER): Payer: Managed Care, Other (non HMO) | Admitting: Women's Health

## 2020-12-25 VITALS — BP 131/72 | HR 100 | Wt 212.8 lb

## 2020-12-25 DIAGNOSIS — O2342 Unspecified infection of urinary tract in pregnancy, second trimester: Secondary | ICD-10-CM

## 2020-12-25 DIAGNOSIS — Z3482 Encounter for supervision of other normal pregnancy, second trimester: Secondary | ICD-10-CM

## 2020-12-25 DIAGNOSIS — Z3492 Encounter for supervision of normal pregnancy, unspecified, second trimester: Secondary | ICD-10-CM

## 2020-12-25 DIAGNOSIS — Z3480 Encounter for supervision of other normal pregnancy, unspecified trimester: Secondary | ICD-10-CM

## 2020-12-25 DIAGNOSIS — O365990 Maternal care for other known or suspected poor fetal growth, unspecified trimester, not applicable or unspecified: Secondary | ICD-10-CM | POA: Diagnosis not present

## 2020-12-25 DIAGNOSIS — R8271 Bacteriuria: Secondary | ICD-10-CM

## 2020-12-25 DIAGNOSIS — Z3A23 23 weeks gestation of pregnancy: Secondary | ICD-10-CM

## 2020-12-25 MED ORDER — ALBUTEROL SULFATE HFA 108 (90 BASE) MCG/ACT IN AERS: 2.0000 | INHALATION_SPRAY | Freq: Four times a day (QID) | RESPIRATORY_TRACT | 2 refills | Status: AC | PRN

## 2020-12-25 NOTE — Progress Notes (Signed)
LOW-RISK PREGNANCY VISIT Patient name: Kelly Mahoney MRN [id number]  Date of birth: 1990/11/21 Chief Complaint:   Routine Prenatal Visit and Pregnancy Ultrasound  History of Present Illness:   Kelly Mahoney is a 30 y.o. G51P1001 female at [redacted]w[redacted]d with an Estimated Date of Delivery: 04/17/21 being seen today for ongoing management of a low-risk pregnancy.  Depression screen [facility] 2/9 10/03/2020 08/16/2016 07/30/2016  Decreased Interest 3 0 0  Down, Depressed, Hopeless 0 0 0  PHQ - 2 Score 3 0 0  Altered sleeping 0 0 -  Tired, decreased energy 3 1 -  Change in appetite 3 0 -  Feeling bad or failure about yourself  0 0 -  Trouble concentrating 2 0 -  Moving slowly or fidgety/restless 0 0 -  Suicidal thoughts 0 0 -  PHQ-9 Score 11 1 -    Today she reports requests refill on inhaler, using about once/wk. Contractions: Irritability. Vag. Bleeding: None.  Movement: Present. denies leaking of fluid. Review of Systems:   Pertinent items are noted in HPI Denies abnormal vaginal discharge w/ itching/odor/irritation, headaches, visual changes, shortness of breath, chest pain, abdominal pain, severe nausea/vomiting, or problems with urination or bowel movements unless otherwise stated above. Pertinent History Reviewed:  Reviewed past medical,surgical, social, obstetrical and family history.  Reviewed problem list, medications and allergies. Physical Assessment:   Vitals:   12/25/20 1429  BP: 131/72  Pulse: 100  Weight: 212 lb 12.8 oz (96.5 kg)  Body mass index is 40.21 kg/m.        Physical Examination:   General appearance: Well appearing, and in no distress  Mental status: Alert, oriented to person, place, and time  Skin: Warm & dry  Cardiovascular: Normal heart rate noted  Respiratory: Normal respiratory effort, no distress  Abdomen: Soft, gravid, nontender  Pelvic: Cervical exam deferred         Extremities: Edema: None  Fetal Status: Fetal Heart Rate (bpm): 159 u/s   Movement:  Present   [name] 23+6 wks,cephalic,fhr 159 bpm,cx 5 cm,left lateral placenta gr 0,svp of fluid 5.5 cm,EFW 591 g 23%   Chaperone: N/A   No results found for this or any previous visit (from the past 24 hour(s)).  Assessment & Plan:  1) Low-risk pregnancy G2P1001 at [redacted]w[redacted]d with an Estimated Date of Delivery: 04/17/21   2) Asthma, refilled inhaler  3) Recent +GBS bacteruria> urine cx poc today   Meds:  Meds ordered this encounter  Medications  . albuterol (VENTOLIN HFA) 108 (90 Base) MCG/ACT inhaler    Sig: Inhale 2 puffs into the lungs every 6 (six) hours as needed for wheezing or shortness of breath.    Dispense:  8 g    Refill:  2    Order Specific Question:   Supervising Provider    Answer:   Lazaro Arms [[id number]]   Labs/procedures today: U/S  Plan:  Continue routine obstetrical care  Next visit: prefers will be in person for pn2    Reviewed: Preterm labor symptoms and general obstetric precautions including but not limited to vaginal bleeding, contractions, leaking of fluid and fetal movement were reviewed in detail with the patient.  All questions were answered. Has home bp cuff. Check bp weekly, let us know if >140/90.   Follow-up: Return in about 4 weeks (around 01/22/2021) for LROB, PN2, CNM, in person.  Future Appointments  Date Time Provider Department Center  01/22/2021  8:30 AM [facility]  01/22/2021 10:50 AM Shawna Clamp  R, CNM [facility]    Orders Placed This Encounter  Procedures  . Urine Culture   Cheral Marker CNM, [facility] 12/25/2020 2:58 PM

## 2020-12-25 NOTE — Progress Notes (Signed)
[name] 23+6 wks,cephalic,fhr 159 bpm,cx 5 cm,left lateral placenta gr 0,svp of fluid 5.5 cm,EFW 591 g 23%

## 2020-12-25 NOTE — Patient Instructions (Signed)
Kelly Mahoney, I greatly value your feedback.  If you receive a survey following your visit with [name] today, we appreciate you taking the time to fill it out.  Thanks, [name], CNM, WHNP-BC   You will have your sugar test next visit.  Please do not eat or drink anything after midnight the night before you come, not even water.  You will be here for at least two hours.  Please make an appointment online for the bloodwork at [email or web address] for 8:30am (or as close to this as possible). Make sure you select the [facility] service center. The day of the appointment, check in with our office first, then you will go to [facility] to start the sugar test.    [facility] [place], [place] [address]) Entrance C, located off of [address] valet parking  Go to [email or web address] to register for FREE online childbirth classes   Call the office [phone]) or go to [facility] if:  You begin to have strong, frequent contractions  Your water breaks.  Sometimes it is a big gush of fluid, sometimes it is just a trickle that keeps getting your panties wet or running down your legs  You have vaginal bleeding.  It is normal to have a small amount of spotting if your cervix was checked.   You don't feel your baby moving like normal.  If you don't, get you something to eat and drink and lay down and focus on feeling your baby move.   If your baby is still not moving like normal, you should call the office or go to [facility].  [place] Pediatricians/Family Doctors:  [facility] [phone]            [facility] [phone]                 [facility] [phone] (usually not accepting new patients unless you have family there already, you are always welcome to call and ask)       [facility] Department [phone]       [facility] Pediatricians/Family Doctors:   [facility]:  [phone]  [facility]: [phone]  [facility]: [phone]  [facility] Doctors:   [facility]: [phone]   [facility]: [phone]  [facility] Doctors:  [facility]: [phone]   Home Blood Pressure Monitoring for Patients   Your provider has recommended that you check your blood pressure (BP) at least once a week at home. If you do not have a blood pressure cuff at home, one will be provided for you. Contact your provider if you have not received your monitor within 1 week.   Helpful Tips for Accurate Home Blood Pressure Checks  . Don't smoke, exercise, or drink caffeine 30 minutes before checking your BP . Use the restroom before checking your BP (a full bladder can raise your pressure) . Relax in a comfortable upright chair . Feet on the ground . Left arm resting comfortably on a flat surface at the level of your heart . Legs uncrossed . Back supported . Sit quietly and don't talk . Place the cuff on your bare arm . Adjust snuggly, so that only two fingertips can fit between your skin and the top of the cuff . Check 2 readings separated by at least one minute . Keep a log of your BP readings . For a visual, please reference  this diagram: http://ccnc.care/bpdiagram  Provider Name: [facility]     Phone: [phone]  Zone 1: ALL CLEAR  Continue to monitor your symptoms:  . BP reading is less than 140 (top number) or less than 90 (bottom number)  . No right upper stomach pain . No headaches or seeing spots . No feeling nauseated or throwing up . No swelling in face and hands  Zone 2: CAUTION Call your doctor's office for any of the following:  . BP reading is greater than 140 (top number) or greater than 90 (bottom number)  . Stomach pain under your ribs in the middle or right side . Headaches or seeing spots . Feeling nauseated or throwing up . Swelling in  face and hands  Zone 3: EMERGENCY  Seek immediate medical care if you have any of the following:  . BP reading is greater than160 (top number) or greater than 110 (bottom number) . Severe headaches not improving with Tylenol . Serious difficulty catching your breath . Any worsening symptoms from Zone 2   Second Trimester of Pregnancy The second trimester is from week 13 through week 28, months 4 through 6. The second trimester is often a time when you feel your best. Your body has also adjusted to being pregnant, and you begin to feel better physically. Usually, morning sickness has lessened or quit completely, you may have more energy, and you may have an increase in appetite. The second trimester is also a time when the fetus is growing rapidly. At the end of the sixth month, the fetus is about 9 inches long and weighs about 1 pounds. You will likely begin to feel the baby move (quickening) between 18 and 20 weeks of the pregnancy. BODY CHANGES Your body goes through many changes during pregnancy. The changes vary from woman to woman.   Your weight will continue to increase. You will notice your lower abdomen bulging out.  You may begin to get stretch marks on your hips, abdomen, and breasts.  You may develop headaches that can be relieved by medicines approved by your health care provider.  You may urinate more often because the fetus is pressing on your bladder.  You may develop or continue to have heartburn as a result of your pregnancy.  You may develop constipation because certain hormones are causing the muscles that push waste through your intestines to slow down.  You may develop hemorrhoids or swollen, bulging veins (varicose veins).  You may have back pain because of the weight gain and pregnancy hormones relaxing your joints between the bones in your pelvis and as a result of a shift in weight and the muscles that support your balance.  Your breasts will continue to grow  and be tender.  Your gums may bleed and may be sensitive to brushing and flossing.  Dark spots or blotches (chloasma, mask of pregnancy) may develop on your face. This will likely fade after the baby is born.  A dark line from your belly button to the pubic area (linea nigra) may appear. This will likely fade after the baby is born.  You may have changes in your hair. These can include thickening of your hair, rapid growth, and changes in texture. Some women also have hair loss during or after pregnancy, or hair that feels dry or thin. Your hair will most likely return to normal after your baby is born. WHAT TO EXPECT AT YOUR PRENATAL VISITS During a routine prenatal visit:  You will  be weighed to make sure you and the fetus are growing normally.  Your blood pressure will be taken.  Your abdomen will be measured to track your baby's growth.  The fetal heartbeat will be listened to.  Any test results from the previous visit will be discussed. Your health care provider may ask you:  How you are feeling.  If you are feeling the baby move.  If you have had any abnormal symptoms, such as leaking fluid, bleeding, severe headaches, or abdominal cramping.  If you have any questions. Other tests that may be performed during your second trimester include:  Blood tests that check for:  Low iron levels (anemia).  Gestational diabetes (between 24 and 28 weeks).  Rh antibodies.  Urine tests to check for infections, diabetes, or protein in the urine.  An ultrasound to confirm the proper growth and development of the baby.  An amniocentesis to check for possible genetic problems.  Fetal screens for spina bifida and Down syndrome. HOME CARE INSTRUCTIONS   Avoid all smoking, herbs, alcohol, and unprescribed drugs. These chemicals affect the formation and growth of the baby.  Follow your health care provider's instructions regarding medicine use. There are medicines that are either  safe or unsafe to take during pregnancy.  Exercise only as directed by your health care provider. Experiencing uterine cramps is a good sign to stop exercising.  Continue to eat regular, healthy meals.  Wear a good support bra for breast tenderness.  Do not use hot tubs, steam rooms, or saunas.  Wear your seat belt at all times when driving.  Avoid raw meat, uncooked cheese, cat litter boxes, and soil used by cats. These carry germs that can cause birth defects in the baby.  Take your prenatal vitamins.  Try taking a stool softener (if your health care provider approves) if you develop constipation. Eat more high-fiber foods, such as fresh vegetables or fruit and whole grains. Drink plenty of fluids to keep your urine clear or pale yellow.  Take warm sitz baths to soothe any pain or discomfort caused by hemorrhoids. Use hemorrhoid cream if your health care provider approves.  If you develop varicose veins, wear support hose. Elevate your feet for 15 minutes, 3-4 times a day. Limit salt in your diet.  Avoid heavy lifting, wear low heel shoes, and practice good posture.  Rest with your legs elevated if you have leg cramps or low back pain.  Visit your dentist if you have not gone yet during your pregnancy. Use a soft toothbrush to brush your teeth and be gentle when you floss.  A sexual relationship may be continued unless your health care provider directs you otherwise.  Continue to go to all your prenatal visits as directed by your health care provider. SEEK MEDICAL CARE IF:   You have dizziness.  You have mild pelvic cramps, pelvic pressure, or nagging pain in the abdominal area.  You have persistent nausea, vomiting, or diarrhea.  You have a bad smelling vaginal discharge.  You have pain with urination. SEEK IMMEDIATE MEDICAL CARE IF:   You have a fever.  You are leaking fluid from your vagina.  You have spotting or bleeding from your vagina.  You have severe  abdominal cramping or pain.  You have rapid weight gain or loss.  You have shortness of breath with chest pain.  You notice sudden or extreme swelling of your face, hands, ankles, feet, or legs.  You have not felt your baby move in  over an hour.  You have severe headaches that do not go away with medicine.  You have vision changes. Document Released: 10/05/2001 Document Revised: 10/16/2013 Document Reviewed: 12/12/2012 [facility] Patient Information 2015 [place], [place]. This information is not intended to replace advice given to you by your health care provider. Make sure you discuss any questions you have with your health care provider.

## 2020-12-27 LAB — URINE CULTURE: Organism ID, Bacteria: NO GROWTH

## 2021-01-03 ENCOUNTER — Inpatient Hospital Stay (HOSPITAL_COMMUNITY)
Admission: AD | Admit: 2021-01-03 | Discharge: 2021-01-03 | Disposition: A | Discharge status: Home / Self Care | Payer: Managed Care, Other (non HMO) | Attending: Family Medicine | Admitting: Family Medicine

## 2021-01-03 ENCOUNTER — Other Ambulatory Visit: Payer: Self-pay

## 2021-01-03 ENCOUNTER — Encounter (HOSPITAL_COMMUNITY): Payer: Self-pay | Admitting: Family Medicine

## 2021-01-03 DIAGNOSIS — Z79899 Other long term (current) drug therapy: Secondary | ICD-10-CM | POA: Insufficient documentation

## 2021-01-03 DIAGNOSIS — N390 Urinary tract infection, site not specified: Secondary | ICD-10-CM | POA: Diagnosis not present

## 2021-01-03 DIAGNOSIS — Z3A25 25 weeks gestation of pregnancy: Secondary | ICD-10-CM | POA: Diagnosis not present

## 2021-01-03 DIAGNOSIS — N949 Unspecified condition associated with female genital organs and menstrual cycle: Secondary | ICD-10-CM

## 2021-01-03 DIAGNOSIS — Z8249 Family history of ischemic heart disease and other diseases of the circulatory system: Secondary | ICD-10-CM | POA: Diagnosis not present

## 2021-01-03 DIAGNOSIS — Z87440 Personal history of urinary (tract) infections: Secondary | ICD-10-CM | POA: Diagnosis not present

## 2021-01-03 DIAGNOSIS — O26852 Spotting complicating pregnancy, second trimester: Secondary | ICD-10-CM | POA: Insufficient documentation

## 2021-01-03 DIAGNOSIS — R102 Pelvic and perineal pain: Secondary | ICD-10-CM | POA: Diagnosis not present

## 2021-01-03 DIAGNOSIS — O26892 Other specified pregnancy related conditions, second trimester: Secondary | ICD-10-CM

## 2021-01-03 DIAGNOSIS — O2342 Unspecified infection of urinary tract in pregnancy, second trimester: Secondary | ICD-10-CM

## 2021-01-03 DIAGNOSIS — Z3689 Encounter for other specified antenatal screening: Secondary | ICD-10-CM

## 2021-01-03 DIAGNOSIS — O4692 Antepartum hemorrhage, unspecified, second trimester: Secondary | ICD-10-CM | POA: Diagnosis not present

## 2021-01-03 DIAGNOSIS — Z3480 Encounter for supervision of other normal pregnancy, unspecified trimester: Secondary | ICD-10-CM

## 2021-01-03 LAB — URINALYSIS, ROUTINE W REFLEX MICROSCOPIC
Bilirubin Urine: NEGATIVE
Glucose, UA: NEGATIVE mg/dL
Ketones, ur: NEGATIVE mg/dL
Leukocytes,Ua: NEGATIVE
Nitrite: NEGATIVE
Protein, ur: NEGATIVE mg/dL
Specific Gravity, Urine: 1.004 — ABNORMAL LOW (ref 1.005–1.030)
pH: 7 (ref 5.0–8.0)

## 2021-01-03 LAB — WET PREP, GENITAL
Clue Cells Wet Prep HPF POC: NONE SEEN
Sperm: NONE SEEN
Trich, Wet Prep: NONE SEEN
Yeast Wet Prep HPF POC: NONE SEEN

## 2021-01-03 MED ORDER — NITROFURANTOIN MONOHYD MACRO 100 MG PO CAPS: 100.0000 mg | ORAL_CAPSULE | Freq: Two times a day (BID) | ORAL | 0 refills | Status: DC

## 2021-01-03 NOTE — Discharge Instructions (Signed)
Pregnancy and Urinary Tract Infection  A urinary tract infection (UTI) is an infection of any part of the urinary tract. This includes the kidneys, the tubes that connect your kidneys to your bladder (ureters), the bladder, and the tube that carries urine out of your body (urethra). These organs make, store, and get rid of urine in the body. Your health care provider may use other names to describe the infection. An upper UTI affects the ureters and kidneys (pyelonephritis). A lower UTI affects the bladder (cystitis) and urethra (urethritis). Most urinary tract infections are caused by bacteria in your genital area, around the entrance to your urinary tract (urethra). These bacteria grow and cause irritation and inflammation of your urinary tract. You are more likely to develop a UTI during pregnancy because the physical and hormonal changes your body goes through can make it easier for bacteria to get into your urinary tract. Your growing baby also puts pressure on your bladder and can affect urine flow. It is important to recognize and treat UTIs in pregnancy because of the risk of serious complications for both you and your baby. How does this affect me? Symptoms of a UTI include:  Needing to urinate right away (urgently).  Frequent urination or passing small amounts of urine frequently.  Pain or burning with urination.  Blood in the urine.  Urine that smells bad or unusual.  Trouble urinating.  Cloudy urine.  Pain in the abdomen or lower back.  Vaginal discharge. You may also have:  Vomiting or a decreased appetite.  Confusion.  Irritability or tiredness.  A fever.  Diarrhea. How does this affect my baby? An untreated UTI during pregnancy could lead to a kidney infection or a systemic infection, which can cause health problems that could affect your baby. Possible complications of an untreated UTI include:  Giving birth to your baby before 37 weeks of pregnancy  (premature).  Having a baby with a low birth weight.  Developing high blood pressure during pregnancy (preeclampsia).  Having a low hemoglobin level (anemia). What can I do to lower my risk? To prevent a UTI:  Go to the bathroom as soon as you feel the need. Do not hold urine for long periods of time.  Always wipe from front to back, especially after a bowel movement. Use each tissue one time when you wipe.  Empty your bladder after sex.  Keep your genital area dry.  Drink 6-10 glasses of water each day.  Do not douche or use deodorant sprays. How is this treated? Treatment for this condition may include:  Antibiotic medicines that are safe to take during pregnancy.  Other medicines to treat less common causes of UTI. Follow these instructions at home:  If you were prescribed an antibiotic medicine, take it as told by your health care provider. Do not stop using the antibiotic even if you start to feel better.  Keep all follow-up visits as told by your health care provider. This is important. Contact a health care provider if:  Your symptoms do not improve or they get worse.  You have abnormal vaginal discharge. Get help right away if you:  Have a fever.  Have nausea and vomiting.  Have back or side pain.  Feel contractions in your uterus.  Have lower belly pain.  Have a gush of fluid from your vagina.  Have blood in your urine. Summary  A urinary tract infection (UTI) is an infection of any part of the urinary tract, which includes the   kidneys, ureters, bladder, and urethra.  Most urinary tract infections are caused by bacteria in your genital area, around the entrance to your urinary tract (urethra).  You are more likely to develop a UTI during pregnancy.  If you were prescribed an antibiotic medicine, take it as told by your health care provider. Do not stop using the antibiotic even if you start to feel better. This information is not intended to  replace advice given to you by your health care provider. Make sure you discuss any questions you have with your health care provider. Document Revised: 02/02/2019 Document Reviewed: 09/14/2018 Elsevier Patient Education  2021 [employer]   Round Ligament Pain during Pregnancy Many women will experience a type of pain referred to as "round ligament pain" during their pregnancy. This is associated with abdominal pain or discomfort. Since any type of abdominal pain during pregnancy can be disconcerting, it is important to talk about round ligament pain to relieve any anxiety or fears you may have regarding the symptoms you are feeling. Round ligament pain is due to normal changes that take place in the body during pregnancy. It is caused by stretching of the round ligaments attached to the uterus. More commonly it occurs on the right side of the pelvis. Round Ligament: An Overview Typically in the non-pregnant state the uterus is about the size of an apple or pear. There are thick ligaments which hold the uterus in place in the abdomen, referred to as round ligaments. During pregnancy, your uterus will expand in size and weight, and the ligaments supporting it will have to stretch, becoming longer and thinner. As these ligaments pull and tug they may irritate nearby nerve fibers, which causes pain. The severity of the pain in some cases can seem extreme. Some common symptoms of round ligament pain include: . Ligament spasms or contractions/cramps that trigger a sharp pain typically on the right side of the abdomen. . Pain upon waking or suddenly rolling over in your sleep. . Pain in the abdomen that is sharp brought on by exercise or other vigorous activity. Similar Problems Round ligament pain is often mistaken for other medical conditions because the symptoms are similar. Acute abdominal pain during pregnancy may also be a sign of other conditions including: . Abdominal cramps - Some  abdominal pain is simply caused by change in bowel habits associated with pregnancy. Gas is a common problem that can cause sharp, shooting pain. You should always seek out medical care if your pain is accompanied by fever, chills, pain upon urination or if you have difficulty walking. Further exams and tests will be conducted to ensure that you do not have a more serious condition. It is not uncommon for women with lower abdominal pain to have a urinary tract infection, thus you may also be asked for a urine sample. Treatment If all other conditions are ruled out you can treat your round ligament pain relatively easily. You may be advised to take some acetaminophen (Tylenol) to reduce the severity of any persistent pain and asked to reduce your activity level. You can apply a heating pad to the area of pain or take a warm bath. Lying on the opposite side of the pain may help as well. Most women will find relief from round ligament pain simply by altering their daily routines slightly. The good news is round ligament pain will disappear completely once you have given birth to your child!

## 2021-01-03 NOTE — MAU Note (Signed)
Saw some light pink bleeding around 1 in the morning, little bit in her pee.  At 0330- had stopped.  But saw it again around 9. Mild cramps in lower abd. Denies recent intercourse

## 2021-01-03 NOTE — MAU Provider Note (Signed)
History     CSN: [id number]  Arrival date and time: 01/03/21 1242   Event Date/Time   First Provider Initiated Contact with Patient 01/03/21 1329      Chief Complaint  Patient presents with  . Vaginal Bleeding   Kelly Mahoney is a 30 y.o G3P1011 at [redacted]w[redacted]d who presents today with spotting since early this morning. She has had some mild cramping that comes and goes. She denies any LOF. She reports normal fetal movement. She also reports that she has had frequent UTIs with this pregnancy.   Vaginal Bleeding The patient's primary symptoms include pelvic pain and vaginal bleeding. This is a new problem. The current episode started today. The problem occurs intermittently. The problem has been unchanged. The problem affects both sides. She is pregnant. Associated symptoms include frequency. Pertinent negatives include no chills, dysuria, fever, nausea, urgency or vomiting. The vaginal bleeding is spotting. Nothing aggravates the symptoms. She has tried nothing for the symptoms. Sexual activity: denies intercourse or anything in the vagina in the last 24 hours.     OB History    Gravida  3   Para  1   Term  1   Preterm      AB  1   Living  1     SAB  1   IAB      Ectopic      Multiple  0   Live Births  1           Past Medical History:  Diagnosis Date  . GERD (gastroesophageal reflux disease)     Past Surgical History:  Procedure Laterality Date  . CHOLECYSTECTOMY N/A 09/05/2017   Procedure: LAPAROSCOPIC CHOLECYSTECTOMY;  Surgeon: Lucretia Roers, MD;  Location: [facility];  Service: General;  Laterality: N/A;  . ERCP N/A 09/04/2017   Procedure: ENDOSCOPIC RETROGRADE CHOLANGIOPANCREATOGRAPHY (ERCP);  Surgeon: Malissa Hippo, MD;  Location: [facility];  Service: Endoscopy;  Laterality: N/A;  . NO PAST SURGERIES    . SPHINCTEROTOMY N/A 09/04/2017   Procedure: SPHINCTEROTOMY , STONE BASKET EXTRACTION;  Surgeon: Malissa Hippo, MD;  Location: [facility];   Service: Endoscopy;  Laterality: N/A;    Family History  Problem Relation Age of Onset  . Hyperlipidemia Father   . Hyperlipidemia Mother   . Liver disease Mother        Fatty Liver, possibly elevated LFTs    Social History   Tobacco Use  . Smoking status: Never Smoker  . Smokeless tobacco: Never Used  Vaping Use  . Vaping Use: Never used  Substance Use Topics  . Alcohol use: No  . Drug use: No    Allergies: No Known Allergies  Medications Prior to Admission  Medication Sig Dispense Refill Last Dose  . albuterol (VENTOLIN HFA) 108 (90 Base) MCG/ACT inhaler Inhale 2 puffs into the lungs every 6 (six) hours as needed for wheezing or shortness of breath. 8 g 2 Past Week at Unknown time  . Doxylamine-Pyridoxine ER (BONJESTA) 20-20 MG TBCR Take 1 tablet by mouth at bedtime. Can add 1 tablet in the morning if needed for nausea and vomiting 60 tablet 8 01/02/2021 at Unknown time  . Prenat-FeCbn-FeAsp-Meth-FA-DHA (PRENATE MINI) 18-0.6-0.4-350 MG CAPS Take 1 capsule by mouth daily. 30 capsule 11 01/02/2021 at Unknown time    Review of Systems  Constitutional: Negative for chills and fever.  Gastrointestinal: Negative for nausea and vomiting.  Genitourinary: Positive for frequency, pelvic pain and vaginal bleeding. Negative for dysuria  and urgency.   Physical Exam   Blood pressure 129/70, pulse 97, temperature 98.2 F (36.8 C), temperature source Oral, resp. rate 18, height 5\' 1"  (1.549 m), weight 96.8 kg, last menstrual period 07/11/2020, SpO2 100 %.  Physical Exam Vitals and nursing note reviewed. Exam conducted with a chaperone present.  Constitutional:      General: She is not in acute distress. HENT:     Head: Normocephalic.  Eyes:     Pupils: Pupils are equal, round, and reactive to light.  Cardiovascular:     Rate and Rhythm: Normal rate.  Pulmonary:     Effort: Pulmonary effort is normal.  Abdominal:     Palpations: Abdomen is soft.     Tenderness: There is no  abdominal tenderness.  Genitourinary:    Comments: Cervix: closed/thick  Skin:    General: Skin is warm and dry.  Neurological:     Mental Status: She is alert and oriented to person, place, and time.  Psychiatric:        Mood and Affect: Mood normal.        Behavior: Behavior normal.      Results for orders placed or performed during the hospital encounter of 01/03/21 (from the past 24 hour(s))  Urinalysis, Routine w reflex microscopic Urine, Clean Catch     Status: Abnormal   Collection Time: 01/03/21  1:08 PM  Result Value Ref Range   Color, Urine STRAW (A) YELLOW   APPearance CLEAR CLEAR   Specific Gravity, Urine 1.004 (L) 1.005 - 1.030   pH 7.0 5.0 - 8.0   Glucose, UA NEGATIVE NEGATIVE mg/dL   Hgb urine dipstick LARGE (A) NEGATIVE   Bilirubin Urine NEGATIVE NEGATIVE   Ketones, ur NEGATIVE NEGATIVE mg/dL   Protein, ur NEGATIVE NEGATIVE mg/dL   Nitrite NEGATIVE NEGATIVE   Leukocytes,Ua NEGATIVE NEGATIVE   RBC / HPF 6-10 0 - 5 RBC/hpf   WBC, UA 0-5 0 - 5 WBC/hpf   Bacteria, UA RARE (A) NONE SEEN   Squamous Epithelial / LPF 0-5 0 - 5  Wet prep, genital     Status: Abnormal   Collection Time: 01/03/21  1:38 PM  Result Value Ref Range   Yeast Wet Prep HPF POC NONE SEEN NONE SEEN   Trich, Wet Prep NONE SEEN NONE SEEN   Clue Cells Wet Prep HPF POC NONE SEEN NONE SEEN   WBC, Wet Prep HPF POC MANY (A) NONE SEEN   Sperm NONE SEEN     NST:  Baseline: 140 Variability: moderate Accels: 10x10 Decels: non Toco: none Reactive/Appropriate for GA   MAU Course  Procedures  MDM Urine culture sent    Assessment and Plan   1. Round ligament pain   2. Supervision of other normal pregnancy, antepartum   3. Urinary tract infection in mother during second trimester of pregnancy   4. [redacted] weeks gestation of pregnancy   5. NST (non-stress test) reactive    DC home Comfort measures reviewed  2nd/3rd Trimester precautions  Bleeding precautions PTL precautions  Fetal kick  counts RX: macrobid BID x 7 days  Return to MAU as needed FU with OB as planned   Follow-up Information    [facility] Follow up.   Specialty: Obstetrics and Gynecology Contact information: [address] [place] [phone]             [phone] DNP, CNM  01/03/21  2:38 PM

## 2021-01-04 LAB — GC/CHLAMYDIA PROBE AMP (~~LOC~~) NOT AT ARMC
Chlamydia: NEGATIVE
Comment: NEGATIVE
Comment: NORMAL
Neisseria Gonorrhea: NEGATIVE

## 2021-01-05 ENCOUNTER — Encounter (HOSPITAL_COMMUNITY): Payer: Self-pay | Admitting: Obstetrics and Gynecology

## 2021-01-05 ENCOUNTER — Inpatient Hospital Stay (HOSPITAL_COMMUNITY)
Admission: AD | Admit: 2021-01-05 | Discharge: 2021-01-05 | Disposition: A | Discharge status: Home / Self Care | Payer: Managed Care, Other (non HMO) | Attending: Obstetrics and Gynecology | Admitting: Obstetrics and Gynecology

## 2021-01-05 ENCOUNTER — Other Ambulatory Visit: Payer: Self-pay

## 2021-01-05 DIAGNOSIS — Z3A25 25 weeks gestation of pregnancy: Secondary | ICD-10-CM | POA: Diagnosis not present

## 2021-01-05 DIAGNOSIS — O26892 Other specified pregnancy related conditions, second trimester: Secondary | ICD-10-CM | POA: Insufficient documentation

## 2021-01-05 DIAGNOSIS — O2342 Unspecified infection of urinary tract in pregnancy, second trimester: Secondary | ICD-10-CM | POA: Diagnosis not present

## 2021-01-05 DIAGNOSIS — M549 Dorsalgia, unspecified: Secondary | ICD-10-CM | POA: Insufficient documentation

## 2021-01-05 DIAGNOSIS — O99891 Other specified diseases and conditions complicating pregnancy: Secondary | ICD-10-CM | POA: Diagnosis not present

## 2021-01-05 LAB — URINALYSIS, ROUTINE W REFLEX MICROSCOPIC
Bilirubin Urine: NEGATIVE
Glucose, UA: NEGATIVE mg/dL
Ketones, ur: NEGATIVE mg/dL
Leukocytes,Ua: NEGATIVE
Nitrite: NEGATIVE
Protein, ur: NEGATIVE mg/dL
RBC / HPF: >50 RBC/hpf — ABNORMAL HIGH (ref 0–5)
Specific Gravity, Urine: 1.02 (ref 1.005–1.030)
pH: 6 (ref 5.0–8.0)

## 2021-01-05 LAB — CULTURE, OB URINE: Culture: 1000 — AB

## 2021-01-05 MED ORDER — CYCLOBENZAPRINE HCL 5 MG PO TABS
10.0000 mg | ORAL_TABLET | Freq: Once | ORAL | Status: AC
  Administered 2021-01-05: 10 mg via ORAL
  Filled 2021-01-05: qty 2

## 2021-01-05 MED ORDER — CYCLOBENZAPRINE HCL 10 MG PO TABS: 10.0000 mg | ORAL_TABLET | Freq: Two times a day (BID) | ORAL | 0 refills | Status: DC | PRN

## 2021-01-05 NOTE — MAU Provider Note (Signed)
History     CSN: [id number]  Arrival date and time: 01/05/21 1445   Event Date/Time   First Provider Initiated Contact with Patient 01/05/21 1556      Chief Complaint  Patient presents with  . Abdominal Pain  . Back Pain   Ms. Kelly Mahoney is a 30 y.o. year old G65P1011 female at [redacted]w[redacted]d weeks gestation who presents to MAU reporting lower, right sided back pain that started at 1100 this morning. She continues to have lower abdominal/suprpubic pain. She was seen in MAU on Saturday and dx'd with a UTI. She started taking the abx (Macrobid) yesterday. She reports feeling like she needs to use the BR (urinate and BM). She receives [facility] with [facility]; next appt is 01/22/2021.   OB History    Gravida  3   Para  1   Term  1   Preterm      AB  1   Living  1     SAB  1   IAB      Ectopic      Multiple  0   Live Births  1           Past Medical History:  Diagnosis Date  . GERD (gastroesophageal reflux disease)     Past Surgical History:  Procedure Laterality Date  . CHOLECYSTECTOMY N/A 09/05/2017   Procedure: LAPAROSCOPIC CHOLECYSTECTOMY;  Surgeon: Lucretia Roers, MD;  Location: [facility];  Service: General;  Laterality: N/A;  . ERCP N/A 09/04/2017   Procedure: ENDOSCOPIC RETROGRADE CHOLANGIOPANCREATOGRAPHY (ERCP);  Surgeon: Malissa Hippo, MD;  Location: [facility];  Service: Endoscopy;  Laterality: N/A;  . NO PAST SURGERIES    . SPHINCTEROTOMY N/A 09/04/2017   Procedure: SPHINCTEROTOMY , STONE BASKET EXTRACTION;  Surgeon: Malissa Hippo, MD;  Location: [facility];  Service: Endoscopy;  Laterality: N/A;    Family History  Problem Relation Age of Onset  . Hyperlipidemia Father   . Hyperlipidemia Mother   . Liver disease Mother        Fatty Liver, possibly elevated LFTs    Social History   Tobacco Use  . Smoking status: Never Smoker  . Smokeless tobacco: Never Used  Vaping Use  . Vaping Use: Never used  Substance Use Topics  . Alcohol use: No   . Drug use: No    Allergies: No Known Allergies  Medications Prior to Admission  Medication Sig Dispense Refill Last Dose  . Doxylamine-Pyridoxine ER (BONJESTA) 20-20 MG TBCR Take 1 tablet by mouth at bedtime. Can add 1 tablet in the morning if needed for nausea and vomiting 60 tablet 8 01/05/2021 at Unknown time  . nitrofurantoin, macrocrystal-monohydrate, (MACROBID) 100 MG capsule Take 1 capsule (100 mg total) by mouth 2 (two) times daily. 14 capsule 0 01/05/2021 at Unknown time  . Prenat-FeCbn-FeAsp-Meth-FA-DHA (PRENATE MINI) 18-0.6-0.4-350 MG CAPS Take 1 capsule by mouth daily. 30 capsule 11 01/05/2021 at Unknown time  . albuterol (VENTOLIN HFA) 108 (90 Base) MCG/ACT inhaler Inhale 2 puffs into the lungs every 6 (six) hours as needed for wheezing or shortness of breath. 8 g 2     Review of Systems  Constitutional: Negative.   HENT: Negative.   Eyes: Negative.   Respiratory: Negative.   Cardiovascular: Negative.   Gastrointestinal: Negative.   Endocrine: Negative.   Genitourinary: Positive for pelvic pain.  Musculoskeletal: Positive for back pain (lower RT side).  Skin: Negative.   Allergic/Immunologic: Negative.   Neurological: Negative.   Hematological: Negative.  Psychiatric/Behavioral: Negative.    Physical Exam   Blood pressure 130/73, pulse 85, temperature 98 F (36.7 C), temperature source Oral, resp. rate 18, weight 97.5 kg, last menstrual period 07/11/2020, SpO2 100 %.  Physical Exam Vitals and nursing note reviewed.  Constitutional:      Appearance: Normal appearance. She is obese.  Cardiovascular:     Rate and Rhythm: Normal rate.  Abdominal:     General: There is no distension.     Palpations: Abdomen is soft. There is no mass.     Tenderness: There is no abdominal tenderness. There is right CVA tenderness (mild). There is no left CVA tenderness or guarding.  Musculoskeletal:        General: Normal range of motion.  Skin:    General: Skin is warm and dry.   Neurological:     Mental Status: She is alert and oriented to person, place, and time.  Psychiatric:        Mood and Affect: Mood normal.        Behavior: Behavior normal.        Thought Content: Thought content normal.        Judgment: Judgment normal.    REACTIVE NST - FHR: 145 bpm / moderate variability / accels present / decels absent / TOCO: no UC's with UI noted  MAU Course  Procedures  MDM CCUA Flexeril 10 mg -- improved back pain  Results for orders placed or performed during the hospital encounter of 01/05/21 (from the past 24 hour(s))  Urinalysis, Routine w reflex microscopic Urine, Clean Catch     Status: Abnormal   Collection Time: 01/05/21  3:36 PM  Result Value Ref Range   Color, Urine AMBER (A) YELLOW   APPearance HAZY (A) CLEAR   Specific Gravity, Urine 1.020 1.005 - 1.030   pH 6.0 5.0 - 8.0   Glucose, UA NEGATIVE NEGATIVE mg/dL   Hgb urine dipstick LARGE (A) NEGATIVE   Bilirubin Urine NEGATIVE NEGATIVE   Ketones, ur NEGATIVE NEGATIVE mg/dL   Protein, ur NEGATIVE NEGATIVE mg/dL   Nitrite NEGATIVE NEGATIVE   Leukocytes,Ua NEGATIVE NEGATIVE   RBC / HPF >50 (H) 0 - 5 RBC/hpf   WBC, UA 0-5 0 - 5 WBC/hpf   Bacteria, UA RARE (A) NONE SEEN   Squamous Epithelial / LPF 0-5 0 - 5   Mucus PRESENT    Hyaline Casts, UA PRESENT    Ca Oxalate Crys, UA PRESENT     Assessment and Plan  Back pain affecting pregnancy in second trimester - Information provided on back pain in pregnancy - Rx for Flexeril 10 mg BID prn pain   UTI (urinary tract infection) during pregnancy, second trimester  - Continue with current regimen of abx  - Discharge patient - Keep scheduled appt with [facility] on 01/22/2021 - Patient verbalized an understanding of the plan of care and agrees.     Raelyn Mora, CNM 01/05/2021, 3:56 PM

## 2021-01-05 NOTE — MAU Note (Signed)
Pain started at 1100. Pain in lower abd/suprapubic area, low back on rt side.  Was here over weekend, dx with UTI- started rx yesterday.  Feels like need to use restroom (urinate and BM)

## 2021-01-06 ENCOUNTER — Telehealth: Payer: Self-pay

## 2021-01-06 ENCOUNTER — Inpatient Hospital Stay (HOSPITAL_COMMUNITY)
Admission: AD | Admit: 2021-01-06 | Discharge: 2021-01-07 | Disposition: A | Discharge status: Home / Self Care | Payer: Managed Care, Other (non HMO) | Attending: Obstetrics and Gynecology | Admitting: Obstetrics and Gynecology

## 2021-01-06 ENCOUNTER — Inpatient Hospital Stay (HOSPITAL_COMMUNITY): Payer: Managed Care, Other (non HMO)

## 2021-01-06 ENCOUNTER — Other Ambulatory Visit: Payer: Self-pay

## 2021-01-06 ENCOUNTER — Encounter (HOSPITAL_COMMUNITY): Payer: Self-pay | Admitting: Obstetrics and Gynecology

## 2021-01-06 DIAGNOSIS — O99891 Other specified diseases and conditions complicating pregnancy: Secondary | ICD-10-CM | POA: Insufficient documentation

## 2021-01-06 DIAGNOSIS — O26892 Other specified pregnancy related conditions, second trimester: Secondary | ICD-10-CM | POA: Diagnosis not present

## 2021-01-06 DIAGNOSIS — R109 Unspecified abdominal pain: Secondary | ICD-10-CM | POA: Diagnosis not present

## 2021-01-06 DIAGNOSIS — R1110 Vomiting, unspecified: Secondary | ICD-10-CM | POA: Insufficient documentation

## 2021-01-06 DIAGNOSIS — M545 Low back pain, unspecified: Secondary | ICD-10-CM | POA: Diagnosis not present

## 2021-01-06 DIAGNOSIS — N200 Calculus of kidney: Secondary | ICD-10-CM

## 2021-01-06 DIAGNOSIS — N1330 Unspecified hydronephrosis: Secondary | ICD-10-CM

## 2021-01-06 DIAGNOSIS — Z3A25 25 weeks gestation of pregnancy: Secondary | ICD-10-CM | POA: Diagnosis not present

## 2021-01-06 DIAGNOSIS — M549 Dorsalgia, unspecified: Secondary | ICD-10-CM | POA: Diagnosis not present

## 2021-01-06 DIAGNOSIS — Z79891 Long term (current) use of opiate analgesic: Secondary | ICD-10-CM | POA: Insufficient documentation

## 2021-01-06 DIAGNOSIS — Z79899 Other long term (current) drug therapy: Secondary | ICD-10-CM | POA: Diagnosis not present

## 2021-01-06 DIAGNOSIS — O2342 Unspecified infection of urinary tract in pregnancy, second trimester: Secondary | ICD-10-CM | POA: Insufficient documentation

## 2021-01-06 DIAGNOSIS — N136 Pyonephrosis: Secondary | ICD-10-CM | POA: Diagnosis not present

## 2021-01-06 DIAGNOSIS — Z3480 Encounter for supervision of other normal pregnancy, unspecified trimester: Secondary | ICD-10-CM

## 2021-01-06 LAB — COMPREHENSIVE METABOLIC PANEL
ALT: 16 U/L (ref 0–44)
AST: 17 U/L (ref 15–41)
Albumin: 3.2 g/dL — ABNORMAL LOW (ref 3.5–5.0)
Alkaline Phosphatase: 71 U/L (ref 38–126)
Anion gap: 7 (ref 5–15)
BUN: 10 mg/dL (ref 6–20)
CO2: 22 mmol/L (ref 22–32)
Calcium: 8.7 mg/dL — ABNORMAL LOW (ref 8.9–10.3)
Chloride: 104 mmol/L (ref 98–111)
Creatinine, Ser: 0.77 mg/dL (ref 0.44–1.00)
GFR, Estimated: >60 mL/min (ref >60)
Glucose, Bld: 88 mg/dL (ref 70–99)
Potassium: 3.2 mmol/L — ABNORMAL LOW (ref 3.5–5.1)
Sodium: 133 mmol/L — ABNORMAL LOW (ref 135–145)
Total Bilirubin: 0.9 mg/dL (ref 0.3–1.2)
Total Protein: 6.4 g/dL — ABNORMAL LOW (ref 6.5–8.1)

## 2021-01-06 LAB — CBC
HCT: 33.3 % — ABNORMAL LOW (ref 36.0–46.0)
Hemoglobin: 11.6 g/dL — ABNORMAL LOW (ref 12.0–15.0)
MCH: 31.8 pg (ref 26.0–34.0)
MCHC: 34.8 g/dL (ref 30.0–36.0)
MCV: 91.2 fL (ref 80.0–100.0)
Platelets: 273 K/uL (ref 150–400)
RBC: 3.65 MIL/uL — ABNORMAL LOW (ref 3.87–5.11)
RDW: 13.4 % (ref 11.5–15.5)
WBC: 13.9 K/uL — ABNORMAL HIGH (ref 4.0–10.5)
nRBC: 0 % (ref 0.0–0.2)

## 2021-01-06 LAB — URINALYSIS, ROUTINE W REFLEX MICROSCOPIC
Bilirubin Urine: NEGATIVE
Glucose, UA: NEGATIVE mg/dL
Ketones, ur: 80 mg/dL — AB
Leukocytes,Ua: NEGATIVE
Nitrite: NEGATIVE
Protein, ur: 30 mg/dL — AB
Specific Gravity, Urine: 1.016 (ref 1.005–1.030)
pH: 5 (ref 5.0–8.0)

## 2021-01-06 MED ORDER — LACTATED RINGERS IV SOLN: INTRAVENOUS | Status: DC

## 2021-01-06 MED ORDER — SODIUM CHLORIDE 0.9 % IV SOLN
25.0000 mg | Freq: Once | INTRAVENOUS | Status: AC
  Administered 2021-01-06: 25 mg via INTRAVENOUS
  Filled 2021-01-06: qty 1

## 2021-01-06 MED ORDER — ONDANSETRON HCL 4 MG/2ML IJ SOLN
4.0000 mg | Freq: Once | INTRAMUSCULAR | Status: AC
  Administered 2021-01-06: 4 mg via INTRAVENOUS
  Filled 2021-01-06: qty 2

## 2021-01-06 MED ORDER — LACTATED RINGERS IV BOLUS
1000.0000 mL | Freq: Once | INTRAVENOUS | Status: AC
  Administered 2021-01-06: 1000 mL via INTRAVENOUS

## 2021-01-06 MED ORDER — M.V.I. ADULT IV INJ
Freq: Once | INTRAVENOUS | Status: AC
  Filled 2021-01-06: qty 10

## 2021-01-06 MED ORDER — HYOSCYAMINE SULFATE 0.125 MG SL SUBL
0.2500 mg | SUBLINGUAL_TABLET | Freq: Once | SUBLINGUAL | Status: AC
  Administered 2021-01-06: 0.25 mg via SUBLINGUAL
  Filled 2021-01-06: qty 2

## 2021-01-06 MED ORDER — HYDROMORPHONE HCL 1 MG/ML IJ SOLN
0.5000 mg | Freq: Once | INTRAMUSCULAR | Status: AC
  Administered 2021-01-06: 0.5 mg via INTRAVENOUS
  Filled 2021-01-06: qty 1

## 2021-01-06 NOTE — MAU Provider Note (Incomplete Revision)
History     CSN: [id number]  Arrival date and time: 01/06/21 1734  Event Date/Time   First Provider Initiated Contact with Patient 01/06/21 1932     Chief Complaint  Patient presents with  . Abdominal Pain  . Back Pain   HPI Kelly Mahoney is a 30 y.o. G3P1011 at [redacted]w[redacted]d who presents to MAU with chief complaint of abdominal and low back pain in the setting of recurrent nausea and vomiting.   Patient states today she experienced a new level of intense vomiting. Vomiting has been concern for the majority of her pregnancy she typically manages it with [place]. Today she was unable to tolerate anything PO. She has only eaten a cookie today.   Patient is s/p evaluation in MAU yesterday 01/05/2021 as well as 01/03/2021. She endorses being diagnosed with a UTI and being prescribed Macrobid and Flexeril. She has not taken those medications today.  On initial CNM assessment patient asks if she can spend the night in the hospital. She denies vaginal bleeding, leaking of fluid, decreased fetal movement, fever, falls, or recent illness.   She receives care with [facility]. Her next appointment is 01/22/2021.  OB History    Gravida  3   Para  1   Term  1   Preterm      AB  1   Living  1     SAB  1   IAB      Ectopic      Multiple  0   Live Births  1           Past Medical History:  Diagnosis Date  . GERD (gastroesophageal reflux disease)     Past Surgical History:  Procedure Laterality Date  . CHOLECYSTECTOMY N/A 09/05/2017   Procedure: LAPAROSCOPIC CHOLECYSTECTOMY;  Surgeon: Lucretia Roers, MD;  Location: [facility];  Service: General;  Laterality: N/A;  . ERCP N/A 09/04/2017   Procedure: ENDOSCOPIC RETROGRADE CHOLANGIOPANCREATOGRAPHY (ERCP);  Surgeon: Malissa Hippo, MD;  Location: [facility];  Service: Endoscopy;  Laterality: N/A;  . NO PAST SURGERIES    . SPHINCTEROTOMY N/A 09/04/2017   Procedure: SPHINCTEROTOMY , STONE BASKET EXTRACTION;  Surgeon:  Malissa Hippo, MD;  Location: [facility];  Service: Endoscopy;  Laterality: N/A;    Family History  Problem Relation Age of Onset  . Hyperlipidemia Father   . Hyperlipidemia Mother   . Liver disease Mother        Fatty Liver, possibly elevated LFTs    Social History   Tobacco Use  . Smoking status: Never Smoker  . Smokeless tobacco: Never Used  Vaping Use  . Vaping Use: Never used  Substance Use Topics  . Alcohol use: No  . Drug use: No    Allergies: No Known Allergies  Medications Prior to Admission  Medication Sig Dispense Refill Last Dose  . albuterol (VENTOLIN HFA) 108 (90 Base) MCG/ACT inhaler Inhale 2 puffs into the lungs every 6 (six) hours as needed for wheezing or shortness of breath. 8 g 2 Past Week at Unknown time  . cyclobenzaprine (FLEXERIL) 10 MG tablet Take 1 tablet (10 mg total) by mouth 2 (two) times daily as needed for muscle spasms. 20 tablet 0 01/06/2021 at Unknown time  . Doxylamine-Pyridoxine ER (BONJESTA) 20-20 MG TBCR Take 1 tablet by mouth at bedtime. Can add 1 tablet in the morning if needed for nausea and vomiting 60 tablet 8 01/06/2021 at Unknown time  . nitrofurantoin, macrocrystal-monohydrate, (MACROBID)  100 MG capsule Take 1 capsule (100 mg total) by mouth 2 (two) times daily. 14 capsule 0 01/06/2021 at Unknown time  . Prenat-FeCbn-FeAsp-Meth-FA-DHA (PRENATE MINI) 18-0.6-0.4-350 MG CAPS Take 1 capsule by mouth daily. 30 capsule 11 01/05/2021 at Unknown time    Review of Systems  Gastrointestinal: Positive for abdominal pain, nausea and vomiting.  Genitourinary: Negative for vaginal bleeding.  Musculoskeletal: Positive for back pain.  All other systems reviewed and are negative.  Physical Exam   Blood pressure 125/69, pulse 94, temperature 98.1 F (36.7 C), temperature source Oral, resp. rate 20, height 5\' 1"  (1.549 m), weight 97.3 kg, last menstrual period 07/11/2020, SpO2 99 %.  Physical Exam Vitals and nursing note reviewed. Exam  conducted with a chaperone present.  Constitutional:      Appearance: She is well-developed.  Cardiovascular:     Rate and Rhythm: Normal rate.     Heart sounds: Normal heart sounds.  Pulmonary:     Effort: Pulmonary effort is normal.     Breath sounds: Normal breath sounds.  Abdominal:     Palpations: Abdomen is soft.     Tenderness: There is no abdominal tenderness. There is no right CVA tenderness or left CVA tenderness.  Skin:    Capillary Refill: Capillary refill takes less than 2 seconds.  Neurological:     Mental Status: She is alert and oriented to person, place, and time.  Psychiatric:        Mood and Affect: Mood normal.        Behavior: Behavior normal.      MAU Course  Procedures  Orders Placed This Encounter  Procedures  . Urinalysis, Routine w reflex microscopic Urine, Clean Catch  . CBC  . Comprehensive metabolic panel  . Blood draw with IV start   Meds ordered this encounter  Medications  . lactated ringers bolus 1,000 mL  . promethazine (PHENERGAN) 25 mg in sodium chloride 0.9 % 50 mL IVPB  . multivitamins adult (INFUVITE ADULT) 10 mL in lactated ringers 1,000 mL infusion   Patient Vitals for the past 24 hrs:  BP Temp Temp src Pulse Resp SpO2 Height Weight  01/06/21 1808 125/69 98.1 F (36.7 C) Oral 94 20 99 % - -  01/06/21 1804 - - - - - - 5\' 1"  (1.549 m) 97.3 kg   Results for orders placed or performed during the hospital encounter of 01/06/21 (from the past 24 hour(s))  Urinalysis, Routine w reflex microscopic Urine, Clean Catch     Status: Abnormal   Collection Time: 01/06/21  6:21 PM  Result Value Ref Range   Color, Urine YELLOW YELLOW   APPearance CLEAR CLEAR   Specific Gravity, Urine 1.016 1.005 - 1.030   pH 5.0 5.0 - 8.0   Glucose, UA NEGATIVE NEGATIVE mg/dL   Hgb urine dipstick SMALL (A) NEGATIVE   Bilirubin Urine NEGATIVE NEGATIVE   Ketones, ur 80 (A) NEGATIVE mg/dL   Protein, ur 30 (A) NEGATIVE mg/dL   Nitrite NEGATIVE NEGATIVE    Leukocytes,Ua NEGATIVE NEGATIVE   RBC / HPF 21-50 0 - 5 RBC/hpf   WBC, UA 0-5 0 - 5 WBC/hpf   Bacteria, UA RARE (A) NONE SEEN   Squamous Epithelial / LPF 0-5 0 - 5  CBC     Status: Abnormal   Collection Time: 01/06/21  7:57 PM  Result Value Ref Range   WBC 13.9 (H) 4.0 - 10.5 K/uL   RBC 3.65 (L) 3.87 - 5.11 MIL/uL   Hemoglobin  11.6 (L) 12.0 - 15.0 g/dL   HCT 60.6 (L) 30.1 - 60.1 %   MCV 91.2 80.0 - 100.0 fL   MCH 31.8 26.0 - 34.0 pg   MCHC 34.8 30.0 - 36.0 g/dL   RDW 09.3 23.5 - 57.3 %   Platelets 273 150 - 400 K/uL   nRBC 0.0 0.0 - 0.2 %   Report given to M. Mayford Knife who assumes care of patient at this time.  Clayton Bibles, MSN, CNM Certified Nurse Midwife, [facility] for [employer], [facility] 01/06/21 8:56 PM   2120: Assumed care Patient complaining of nausea and abdominal and back pain Will order Zofran and Levsin for presumed gastroenteritis  2320: Got no relief from above Having a lot of pain on right side, mostly RLQ Judeth Horn NP ordered a renal US If that is negative will order MRI to R/O appendicitis 2339 Toco placed to reassure no PTL Cervix is closed and long FFn sent just to be sure this is not PTL Just finished Renal US  Assessment and Plan

## 2021-01-06 NOTE — Telephone Encounter (Signed)
Pt called stating that she went to ED and is being treated for a UTI and is vomiting and cannot keep food down and she doesn't have any energy.

## 2021-01-06 NOTE — MAU Provider Note (Addendum)
History     CSN: [id number]  Arrival date and time: 01/06/21 1734  Event Date/Time   First Provider Initiated Contact with Patient 01/06/21 1932     Chief Complaint  Patient presents with   Abdominal Pain   Back Pain   HPI Kelly Mahoney is a 30 y.o. G3P1011 at [redacted]w[redacted]d who presents to MAU with chief complaint of abdominal and low back pain in the setting of recurrent nausea and vomiting.   Patient states today she experienced a new level of intense vomiting. Vomiting has been concern for the majority of her pregnancy she typically manages it with [place]. Today she was unable to tolerate anything PO. She has only eaten a cookie today.   Patient is s/p evaluation in MAU yesterday 01/05/2021 as well as 01/03/2021. She endorses being diagnosed with a UTI and being prescribed Macrobid and Flexeril. She has not taken those medications today.  On initial CNM assessment patient asks if she can spend the night in the hospital. She denies vaginal bleeding, leaking of fluid, decreased fetal movement, fever, falls, or recent illness.   She receives care with [facility]. Her next appointment is 01/22/2021.  OB History    Gravida  3   Para  1   Term  1   Preterm      AB  1   Living  1     SAB  1   IAB      Ectopic      Multiple  0   Live Births  1           Past Medical History:  Diagnosis Date   GERD (gastroesophageal reflux disease)     Past Surgical History:  Procedure Laterality Date   CHOLECYSTECTOMY N/A 09/05/2017   Procedure: LAPAROSCOPIC CHOLECYSTECTOMY;  Surgeon: Lucretia Roers, MD;  Location: [facility];  Service: General;  Laterality: N/A;   ERCP N/A 09/04/2017   Procedure: ENDOSCOPIC RETROGRADE CHOLANGIOPANCREATOGRAPHY (ERCP);  Surgeon: Malissa Hippo, MD;  Location: [facility];  Service: Endoscopy;  Laterality: N/A;   NO PAST SURGERIES     SPHINCTEROTOMY N/A 09/04/2017   Procedure: SPHINCTEROTOMY , STONE BASKET EXTRACTION;  Surgeon:  Malissa Hippo, MD;  Location: [facility];  Service: Endoscopy;  Laterality: N/A;    Family History  Problem Relation Age of Onset   Hyperlipidemia Father    Hyperlipidemia Mother    Liver disease Mother        Fatty Liver, possibly elevated LFTs    Social History   Tobacco Use   Smoking status: Never Smoker   Smokeless tobacco: Never Used  Vaping Use   Vaping Use: Never used  Substance Use Topics   Alcohol use: No   Drug use: No    Allergies: No Known Allergies  Medications Prior to Admission  Medication Sig Dispense Refill Last Dose   albuterol (VENTOLIN HFA) 108 (90 Base) MCG/ACT inhaler Inhale 2 puffs into the lungs every 6 (six) hours as needed for wheezing or shortness of breath. 8 g 2 Past Week at Unknown time   cyclobenzaprine (FLEXERIL) 10 MG tablet Take 1 tablet (10 mg total) by mouth 2 (two) times daily as needed for muscle spasms. 20 tablet 0 01/06/2021 at Unknown time   Doxylamine-Pyridoxine ER (BONJESTA) 20-20 MG TBCR Take 1 tablet by mouth at bedtime. Can add 1 tablet in the morning if needed for nausea and vomiting 60 tablet 8 01/06/2021 at Unknown time   nitrofurantoin, macrocrystal-monohydrate, (MACROBID)  100 MG capsule Take 1 capsule (100 mg total) by mouth 2 (two) times daily. 14 capsule 0 01/06/2021 at Unknown time   Prenat-FeCbn-FeAsp-Meth-FA-DHA (PRENATE MINI) 18-0.6-0.4-350 MG CAPS Take 1 capsule by mouth daily. 30 capsule 11 01/05/2021 at Unknown time    Review of Systems  Gastrointestinal: Positive for abdominal pain, nausea and vomiting.  Genitourinary: Negative for vaginal bleeding.  Musculoskeletal: Positive for back pain.  All other systems reviewed and are negative.  Physical Exam   Blood pressure 125/69, pulse 94, temperature 98.1 F (36.7 C), temperature source Oral, resp. rate 20, height 5\' 1"  (1.549 m), weight 97.3 kg, last menstrual period 07/11/2020, SpO2 99 %.  Physical Exam Vitals and nursing note reviewed. Exam  conducted with a chaperone present.  Constitutional:      Appearance: She is well-developed.  Cardiovascular:     Rate and Rhythm: Normal rate.     Heart sounds: Normal heart sounds.  Pulmonary:     Effort: Pulmonary effort is normal.     Breath sounds: Normal breath sounds.  Abdominal:     Palpations: Abdomen is soft.     Tenderness: There is no abdominal tenderness. There is no right CVA tenderness or left CVA tenderness.  Skin:    Capillary Refill: Capillary refill takes less than 2 seconds.  Neurological:     Mental Status: She is alert and oriented to person, place, and time.  Psychiatric:        Mood and Affect: Mood normal.        Behavior: Behavior normal.      MAU Course  Procedures  Orders Placed This Encounter  Procedures   Urinalysis, Routine w reflex microscopic Urine, Clean Catch   CBC   Comprehensive metabolic panel   Blood draw with IV start   Meds ordered this encounter  Medications   lactated ringers bolus 1,000 mL   promethazine (PHENERGAN) 25 mg in sodium chloride 0.9 % 50 mL IVPB   multivitamins adult (INFUVITE ADULT) 10 mL in lactated ringers 1,000 mL infusion   Patient Vitals for the past 24 hrs:  BP Temp Temp src Pulse Resp SpO2 Height Weight  01/06/21 1808 125/69 98.1 F (36.7 C) Oral 94 20 99 % -- --  01/06/21 1804 -- -- -- -- -- -- 5\' 1"  (1.549 m) 97.3 kg   Results for orders placed or performed during the hospital encounter of 01/06/21 (from the past 24 hour(s))  Urinalysis, Routine w reflex microscopic Urine, Clean Catch     Status: Abnormal   Collection Time: 01/06/21  6:21 PM  Result Value Ref Range   Color, Urine YELLOW YELLOW   APPearance CLEAR CLEAR   Specific Gravity, Urine 1.016 1.005 - 1.030   pH 5.0 5.0 - 8.0   Glucose, UA NEGATIVE NEGATIVE mg/dL   Hgb urine dipstick SMALL (A) NEGATIVE   Bilirubin Urine NEGATIVE NEGATIVE   Ketones, ur 80 (A) NEGATIVE mg/dL   Protein, ur 30 (A) NEGATIVE mg/dL   Nitrite NEGATIVE  NEGATIVE   Leukocytes,Ua NEGATIVE NEGATIVE   RBC / HPF 21-50 0 - 5 RBC/hpf   WBC, UA 0-5 0 - 5 WBC/hpf   Bacteria, UA RARE (A) NONE SEEN   Squamous Epithelial / LPF 0-5 0 - 5  CBC     Status: Abnormal   Collection Time: 01/06/21  7:57 PM  Result Value Ref Range   WBC 13.9 (H) 4.0 - 10.5 K/uL   RBC 3.65 (L) 3.87 - 5.11 MIL/uL   Hemoglobin  11.6 (L) 12.0 - 15.0 g/dL   HCT 81.133.3 (L) 91.436.0 - 78.246.0 %   MCV 91.2 80.0 - 100.0 fL   MCH 31.8 26.0 - 34.0 pg   MCHC 34.8 30.0 - 36.0 g/dL   RDW 95.613.4 21.311.5 - 08.615.5 %   Platelets 273 150 - 400 K/uL   nRBC 0.0 0.0 - 0.2 %   Report given to M. Mayford KnifeWilliams who assumes care of patient at this time.  Clayton BiblesSamantha Weinhold, MSN, CNM Certified Nurse Midwife, [facility] [employer], [facility] 01/06/21 8:56 PM   2120: Assumed care Patient complaining of nausea and abdominal and back pain Will order Zofran and Levsin for presumed gastroenteritis  2320: Got no relief from above Having a lot of pain on right side, mostly RLQ Judeth HornErin Lawrence NP ordered a renal US If that is negative will order MRI to R/O appendicitis 2339 Toco placed to reassure no PTL Cervix is closed and long FFn sent just to be sure this is not PTL Just finished Renal US 0028: Renal US is negative except moderate hydronephrosis on right Fetal fibronectin is negative Will get MRI due to RLQ pain and mold leukocytosis  MR PELVIS WO CONTRAST  Result Date: 01/07/2021 CLINICAL DATA:  Nausea and vomiting,, abdominal and low back pain, recent diagnosis of urinary tract infection EXAM: MRI ABDOMEN AND PELVIS WITHOUT CONTRAST TECHNIQUE: Multiplanar multisequence MR imaging of the abdomen and pelvis was performed. No intravenous contrast was administered. COMPARISON:  01/06/2021 FINDINGS: COMBINED FINDINGS FOR BOTH MR ABDOMEN AND PELVIS Hepatobiliary: The visualized portions of the liver are unremarkable. The gallbladder surgically absent. No biliary dilation.  Pancreas: No mass, inflammatory changes, or other parenchymal abnormality identified. Spleen: Visualized portions of the spleen are unremarkable. No splenomegaly. Adrenals/Urinary Tract: There is moderate right-sided hydronephrosis and hydroureter. At the right UVJ, there is a 4 mm focus of decreased signal on all sequences, suspicious for an obstructing right UVJ calculus. Mild distension of the left renal pelvis and proximal left ureter likely due to extrinsic compression by the gravid uterus. No focal renal parenchymal abnormalities on this unenhanced exam. Bladder is minimally distended without filling defect. No adrenal abnormalities. Stomach/Bowel: No bowel obstruction or ileus. No apparent bowel wall thickening or inflammatory change. A normal retrocecal appendix is identified. No evidence of appendicitis. Vascular/Lymphatic: No pathologically enlarged lymph nodes identified. No abdominal aortic aneurysm demonstrated. Reproductive: Gravid uterus is identified. Single intrauterine pregnancy is seen in cephalic presentation. Placenta is seen along the left lateral aspect, with no evidence of placenta previa. This examination was not tailored for the evaluation of fetal anomalies. Other:  No free intraperitoneal fluid.  No abdominal wall hernia. Musculoskeletal: No acute abnormalities. IMPRESSION: 1. Moderate right hydronephrosis and hydroureter to the level of the right UVJ, where a possible 4 mm obstructing UVJ calculus is visualized. 2. Mild distension of the left renal pelvis and proximal left ureter, likely due to extrinsic compression by the gravid uterus. 3. Normal appendix. 4. Single intrauterine pregnancy in cephalic presentation. This examination was not tailored for the evaluation of fetal anomalies. Electronically Signed   By: Sharlet SalinaMichael  Brown M.D.   On: 01/07/2021 03:15   MR ABDOMEN WO CONTRAST  Result Date: 01/07/2021 CLINICAL DATA:  Nausea and vomiting,, abdominal and low back pain, recent  diagnosis of urinary tract infection EXAM: MRI ABDOMEN AND PELVIS WITHOUT CONTRAST TECHNIQUE: Multiplanar multisequence MR imaging of the abdomen and pelvis was performed. No intravenous contrast was administered. COMPARISON:  01/06/2021 FINDINGS:  COMBINED FINDINGS FOR BOTH MR ABDOMEN AND PELVIS Hepatobiliary: The visualized portions of the liver are unremarkable. The gallbladder surgically absent. No biliary dilation. Pancreas: No mass, inflammatory changes, or other parenchymal abnormality identified. Spleen: Visualized portions of the spleen are unremarkable. No splenomegaly. Adrenals/Urinary Tract: There is moderate right-sided hydronephrosis and hydroureter. At the right UVJ, there is a 4 mm focus of decreased signal on all sequences, suspicious for an obstructing right UVJ calculus. Mild distension of the left renal pelvis and proximal left ureter likely due to extrinsic compression by the gravid uterus. No focal renal parenchymal abnormalities on this unenhanced exam. Bladder is minimally distended without filling defect. No adrenal abnormalities. Stomach/Bowel: No bowel obstruction or ileus. No apparent bowel wall thickening or inflammatory change. A normal retrocecal appendix is identified. No evidence of appendicitis. Vascular/Lymphatic: No pathologically enlarged lymph nodes identified. No abdominal aortic aneurysm demonstrated. Reproductive: Gravid uterus is identified. Single intrauterine pregnancy is seen in cephalic presentation. Placenta is seen along the left lateral aspect, with no evidence of placenta previa. This examination was not tailored for the evaluation of fetal anomalies. Other:  No free intraperitoneal fluid.  No abdominal wall hernia. Musculoskeletal: No acute abnormalities. IMPRESSION: 1. Moderate right hydronephrosis and hydroureter to the level of the right UVJ, where a possible 4 mm obstructing UVJ calculus is visualized. 2. Mild distension of the left renal pelvis and proximal left  ureter, likely due to extrinsic compression by the gravid uterus. 3. Normal appendix. 4. Single intrauterine pregnancy in cephalic presentation. This examination was not tailored for the evaluation of fetal anomalies. Electronically Signed   By: Sharlet Salina M.D.   On: 01/07/2021 03:15   US RENAL  Result Date: 01/06/2021 CLINICAL DATA:  Right flank pain, [redacted] weeks pregnant EXAM: RENAL / URINARY TRACT ULTRASOUND COMPLETE COMPARISON:  None. FINDINGS: Right Kidney: Renal measurements: 12.5 x 5.6 x 7.2 cm = volume: 263 mL. Echogenicity within normal limits. No mass. Mild to moderate hydronephrosis Left Kidney: Renal measurements: 10.7 x 6.3 x 5.7 cm = volume: 201 mL. Echogenicity within normal limits. No mass or hydronephrosis visualized. Bladder: Appears empty, not well evaluated Other: None. IMPRESSION: 1. Mild to moderate right hydronephrosis 2. Normal ultrasound appearance of left kidney Electronically Signed   By: Jasmine Pang M.D.   On: 01/06/2021 23:30     Assessment and Plan  A:  SIngle IUP at [redacted]w[redacted]d       Right 12mm Renal stone       Right hydronephrosis  P:   Discharge home       Rx Flomax to facilitate passage       Rx Percocet for pain      Push PO fluids      Followup in office Encouraged to return if she develops worsening of symptoms, increase in pain, fever, or other concerning symptoms.    Aviva Signs, CNM

## 2021-01-06 NOTE — Telephone Encounter (Signed)
Pt unable to keep anything down and feeling significantly worse. Advised to go to ER for possible fluids and IV meds.

## 2021-01-06 NOTE — MAU Note (Signed)
Presents with c/o lower abdominal pain and back pain that began yesterday. Also c/o red dots on face. Denies VB or LOF.  Endorses +FM.

## 2021-01-07 ENCOUNTER — Inpatient Hospital Stay (HOSPITAL_COMMUNITY): Payer: Managed Care, Other (non HMO)

## 2021-01-07 LAB — FETAL FIBRONECTIN: Fetal Fibronectin: NEGATIVE

## 2021-01-07 MED ORDER — OXYCODONE-ACETAMINOPHEN 5-325 MG PO TABS
1.0000 | ORAL_TABLET | Freq: Once | ORAL | Status: AC
  Administered 2021-01-07: 1 via ORAL
  Filled 2021-01-07: qty 1

## 2021-01-07 MED ORDER — TAMSULOSIN HCL 0.4 MG PO CAPS: 0.4000 mg | ORAL_CAPSULE | Freq: Every day | ORAL | 1 refills | Status: DC

## 2021-01-07 MED ORDER — OXYCODONE-ACETAMINOPHEN 5-325 MG PO TABS: 1.0000 | ORAL_TABLET | Freq: Four times a day (QID) | ORAL | 0 refills | Status: DC | PRN

## 2021-01-07 NOTE — MAU Note (Signed)
Pt to MRI, pt IV saline locked provider M. Mayford Knife, CNM made aware

## 2021-01-07 NOTE — Discharge Instructions (Signed)
Kidney Stones  Kidney stones are solid, rock-like deposits that form inside of the kidneys. The kidneys are a pair of organs that make urine. A kidney stone may form in a kidney and move into other parts of the urinary tract, including the tubes that connect the kidneys to the bladder (ureters), the bladder, and the tube that carries urine out of the body (urethra). As the stone moves through these areas, it can cause intense pain and block the flow of urine. Kidney stones are created when high levels of certain minerals are found in the urine. The stones are usually passed out of the body through urination, but in some cases, medical treatment may be needed to remove them. What are the causes? Kidney stones may be caused by:  A condition in which certain glands produce too much parathyroid hormone (primary hyperparathyroidism), which causes too much calcium buildup in the blood.  A buildup of uric acid crystals in the bladder (hyperuricosuria). Uric acid is a chemical that the body produces when you eat certain foods. It usually exits the body in the urine.  Narrowing (stricture) of one or both of the ureters.  A kidney blockage that is present at birth (congenital obstruction).  Past surgery on the kidney or the ureters, such as gastric bypass surgery. What increases the risk? The following factors may make you more likely to develop this condition:  Having had a kidney stone in the past.  Having a family history of kidney stones.  Not drinking enough water.  Eating a diet that is high in protein, salt (sodium), or sugar.  Being overweight or obese. What are the signs or symptoms? Symptoms of a kidney stone may include:  Pain in the side of the abdomen, right below the ribs (flank pain). Pain usually spreads (radiates) to the groin.  Needing to urinate frequently or urgently.  Painful urination.  Blood in the urine (hematuria).  Nausea.  Vomiting.  Fever and chills. How  is this diagnosed? This condition may be diagnosed based on:  Your symptoms and medical history.  A physical exam.  Blood tests.  Urine tests. These may be done before and after the stone passes out of your body through urination.  Imaging tests, such as a CT scan, abdominal X-ray, or ultrasound.  A procedure to examine the inside of the bladder (cystoscopy). How is this treated? Treatment for kidney stones depends on the size, location, and makeup of the stones. Kidney stones will often pass out of the body through urination. You may need to:  Increase your fluid intake to help pass the stone. In some cases, you may be given fluids through an IV and may need to be monitored at the hospital.  Take medicine for pain.  Make changes in your diet to help prevent kidney stones from coming back. Sometimes, medical procedures are needed to remove a kidney stone. This may involve:  A procedure to break up kidney stones using: ? A focused beam of light (laser therapy). ? Shock waves (extracorporeal shock wave lithotripsy).  Surgery to remove kidney stones. This may be needed if you have severe pain or have stones that block your urinary tract. Follow these instructions at home: Medicines  Take over-the-counter and prescription medicines only as told by your health care provider.  Ask your health care provider if the medicine prescribed to you requires you to avoid driving or using heavy machinery. Eating and drinking  Drink enough fluid to keep your urine pale yellow.   You may be instructed to drink at least 8-10 glasses of water each day. This will help you pass the kidney stone.  If directed, change your diet. This may include: ? Limiting how much sodium you eat. ? Eating more fruits and vegetables. ? Limiting how much animal protein--such as red meat, poultry, fish, and eggs--you eat.  Follow instructions from your health care provider about eating or drinking  restrictions. General instructions  Collect urine samples as told by your health care provider. You may need to collect a urine sample: ? 24 hours after you pass the stone. ? 8-12 weeks after passing the kidney stone, and every 6-12 months after that.  Strain your urine every time you urinate, for as long as directed. Use the strainer that your health care provider recommends.  Do not throw out the kidney stone after passing it. Keep the stone so it can be tested by your health care provider. Testing the makeup of your kidney stone may help prevent you from getting kidney stones in the future.  Keep all follow-up visits as told by your health care provider. This is important. You may need follow-up X-rays or ultrasounds to make sure that your stone has passed. How is this prevented? To prevent another kidney stone:  Drink enough fluid to keep your urine pale yellow. This is the best way to prevent kidney stones.  Eat a healthy diet and follow recommendations from your health care provider about foods to avoid. You may be instructed to eat a low-protein diet. Recommendations vary depending on the type of kidney stone that you have.  Maintain a healthy weight.   Where to find more information  National Kidney Foundation (NKF): www.kidney.org  [employer] (UCF): www.urologyhealth.org Contact a health care provider if:  You have pain that gets worse or does not get better with medicine. Get help right away if:  You have a fever or chills.  You develop severe pain.  You develop new abdominal pain.  You faint.  You are unable to urinate. Summary  Kidney stones are solid, rock-like deposits that form inside of the kidneys.  Kidney stones can cause nausea, vomiting, blood in the urine, abdominal pain, and the urge to urinate frequently.  Treatment for kidney stones depends on the size, location, and makeup of the stones. Kidney stones will often pass out of the body  through urination.  Kidney stones can be prevented by drinking enough fluids, eating a healthy diet, and maintaining a healthy weight. This information is not intended to replace advice given to you by your health care provider. Make sure you discuss any questions you have with your health care provider. Document Revised: 02/27/2019 Document Reviewed: 02/27/2019 Elsevier Patient Education  2021 Elsevier Inc.  

## 2021-01-22 ENCOUNTER — Ambulatory Visit (INDEPENDENT_AMBULATORY_CARE_PROVIDER_SITE_OTHER): Payer: Managed Care, Other (non HMO) | Admitting: Women's Health

## 2021-01-22 ENCOUNTER — Encounter: Payer: Self-pay | Admitting: Women's Health

## 2021-01-22 ENCOUNTER — Other Ambulatory Visit: Payer: Self-pay

## 2021-01-22 ENCOUNTER — Other Ambulatory Visit: Payer: Managed Care, Other (non HMO)

## 2021-01-22 VITALS — BP 135/79 | HR 107 | Wt 219.2 lb

## 2021-01-22 DIAGNOSIS — Z3480 Encounter for supervision of other normal pregnancy, unspecified trimester: Secondary | ICD-10-CM

## 2021-01-22 DIAGNOSIS — Z3A27 27 weeks gestation of pregnancy: Secondary | ICD-10-CM

## 2021-01-22 DIAGNOSIS — Z3482 Encounter for supervision of other normal pregnancy, second trimester: Secondary | ICD-10-CM

## 2021-01-22 DIAGNOSIS — Z131 Encounter for screening for diabetes mellitus: Secondary | ICD-10-CM

## 2021-01-22 DIAGNOSIS — Z23 Encounter for immunization: Secondary | ICD-10-CM | POA: Diagnosis not present

## 2021-01-22 DIAGNOSIS — O2342 Unspecified infection of urinary tract in pregnancy, second trimester: Secondary | ICD-10-CM

## 2021-01-22 NOTE — Progress Notes (Signed)
   LOW-RISK PREGNANCY VISIT Patient name: Kelly Mahoney MRN [id number]  Date of birth: 06/13/1991 Chief Complaint:   Routine Prenatal Visit  History of Present Illness:   Kelly Mahoney is a 30 y.o. G68P1011 female at [redacted]w[redacted]d with an Estimated Date of Delivery: 04/17/21 being seen today for ongoing management of a low-risk pregnancy.  Depression screen [facility] 2/9 10/03/2020 08/16/2016 07/30/2016  Decreased Interest 3 0 0  Down, Depressed, Hopeless 0 0 0  PHQ - 2 Score 3 0 0  Altered sleeping 0 0 -  Tired, decreased energy 3 1 -  Change in appetite 3 0 -  Feeling bad or failure about yourself  0 0 -  Trouble concentrating 2 0 -  Moving slowly or fidgety/restless 0 0 -  Suicidal thoughts 0 0 -  PHQ-9 Score 11 1 -    Today she reports went to hospital, dx w/ UTI, finished meds, feels better. Plans to move to [facility] at end of pregnancy and deliver there, so mom (who lives there) can help her. Contractions: Not present. Vag. Bleeding: None.  Movement: Present. denies leaking of fluid. Review of Systems:   Pertinent items are noted in HPI Denies abnormal vaginal discharge w/ itching/odor/irritation, headaches, visual changes, shortness of breath, chest pain, abdominal pain, severe nausea/vomiting, or problems with urination or bowel movements unless otherwise stated above. Pertinent History Reviewed:  Reviewed past medical,surgical, social, obstetrical and family history.  Reviewed problem list, medications and allergies. Physical Assessment:   Vitals:   01/22/21 1056  BP: 135/79  Pulse: (!) 107  Weight: 219 lb 3.2 oz (99.4 kg)  Body mass index is 41.42 kg/m.        Physical Examination:   General appearance: Well appearing, and in no distress  Mental status: Alert, oriented to person, place, and time  Skin: Warm & dry  Cardiovascular: Normal heart rate noted  Respiratory: Normal respiratory effort, no distress  Abdomen: Soft, gravid, nontender  Pelvic: Cervical exam deferred          Extremities: Edema: None  Fetal Status: Fetal Heart Rate (bpm): 158 Fundal Height: 27 cm Movement: Present    Chaperone: N/A   No results found for this or any previous visit (from the past 24 hour(s)).  Assessment & Plan:  1) Low-risk pregnancy G3P1011 at [redacted]w[redacted]d with an Estimated Date of Delivery: 04/17/21   2) Recent UTI, urine cx today   Meds: No orders of the defined types were placed in this encounter.  Labs/procedures today: tdap, PN2 and declined flu shot  Plan:  Continue routine obstetrical care  Next visit: prefers in person    Reviewed: Preterm labor symptoms and general obstetric precautions including but not limited to vaginal bleeding, contractions, leaking of fluid and fetal movement were reviewed in detail with the patient.  All questions were answered. Does have home bp cuff. Office bp cuff given: not applicable. Check bp weekly, let us know if consistently >140 and/or >90.  Follow-up: Return in about 4 weeks (around 02/19/2021) for LROB, in person, CNM.  No future appointments.  Orders Placed This Encounter  Procedures  . Urine Culture  . Tdap vaccine greater than or equal to 7yo IM   Cheral Marker CNM, [facility] 01/22/2021 11:29 AM

## 2021-01-22 NOTE — Patient Instructions (Signed)
Kelly Mahoney, I greatly value your feedback.  If you receive a survey following your visit with [name] today, we appreciate you taking the time to fill it out.  Thanks, [name], CNM, WHNP-BC   [facility] ([address] [place], [place] [address]) Entrance C, located off of [address] valet parking  Go to [email or web address] to register for FREE online childbirth classes   Call the office [phone]) or go to [facility] if:  You begin to have strong, frequent contractions  Your water breaks.  Sometimes it is a big gush of fluid, sometimes it is just a trickle that keeps getting your panties wet or running down your legs  You have vaginal bleeding.  It is normal to have a small amount of spotting if your cervix was checked.   You don't feel your baby moving like normal.  If you don't, get you something to eat and drink and lay down and focus on feeling your baby move.  You should feel at least 10 movements in 2 hours.  If you don't, you should call the office or go to [facility].    Tdap Vaccine  It is recommended that you get the Tdap vaccine during the third trimester of EACH pregnancy to help protect your baby from getting pertussis (whooping cough)  27-36 weeks is the BEST time to do this so that you can pass the protection on to your baby. During pregnancy is better than after pregnancy, but if you are unable to get it during pregnancy it will be offered at the hospital.   You can get this vaccine with [name], at the health department, your family doctor, or some local pharmacies  Everyone who will be around your baby should also be up-to-date on their vaccines before the baby comes. Adults (who are not pregnant) only need 1 dose of Tdap during adulthood.   [place] Pediatricians/Family Doctors:  [facility] [phone]            [facility] [phone]                 [facility]  [phone] (usually not accepting new patients unless you have family there already, you are always welcome to call and ask)       [facility] Department [phone]       [facility] Pediatricians/Family Doctors:   [facility]: [phone]  [facility]: [phone]  [facility]: [phone]  [facility] Doctors:   [facility]: [phone]   [facility]: [phone]  [facility] Doctors:  [facility]: [phone]   Home Blood Pressure Monitoring for Patients   Your provider has recommended that you check your blood pressure (BP) at least once a week at home. If you do not have a blood pressure cuff at home, one will be provided for you. Contact your provider if you have not received your monitor within 1 week.   Helpful Tips for Accurate Home Blood Pressure Checks  . Don't smoke, exercise, or drink caffeine 30 minutes before checking your BP . Use the restroom before checking your BP (a full bladder can raise your pressure) . Relax in a comfortable upright chair . Feet on the ground . Left arm resting comfortably on a flat surface at the level of your heart . Legs uncrossed . Back supported . Sit quietly and don't talk . Place the cuff on your bare  arm . Adjust snuggly, so that only two fingertips can fit between your skin and the top of the cuff . Check 2 readings separated by at least one minute . Keep a log of your BP readings . For a visual, please reference this diagram: http://ccnc.care/bpdiagram  Provider Name: [facility]     Phone: [phone]  Zone 1: ALL CLEAR  Continue to monitor your symptoms:  . BP reading is less than 140 (top number) or less than 90 (bottom number)  . No right upper stomach pain . No headaches or seeing spots . No feeling nauseated or throwing up . No swelling in face and hands  Zone 2: CAUTION Call your  doctor's office for any of the following:  . BP reading is greater than 140 (top number) or greater than 90 (bottom number)  . Stomach pain under your ribs in the middle or right side . Headaches or seeing spots . Feeling nauseated or throwing up . Swelling in face and hands  Zone 3: EMERGENCY  Seek immediate medical care if you have any of the following:  . BP reading is greater than160 (top number) or greater than 110 (bottom number) . Severe headaches not improving with Tylenol . Serious difficulty catching your breath . Any worsening symptoms from Zone 2   Third Trimester of Pregnancy The third trimester is from week 29 through week 42, months 7 through 9. The third trimester is a time when the fetus is growing rapidly. At the end of the ninth month, the fetus is about 20 inches in length and weighs 6-10 pounds.  BODY CHANGES Your body goes through many changes during pregnancy. The changes vary from woman to woman.   Your weight will continue to increase. You can expect to gain 25-35 pounds (11-16 kg) by the end of the pregnancy.  You may begin to get stretch marks on your hips, abdomen, and breasts.  You may urinate more often because the fetus is moving lower into your pelvis and pressing on your bladder.  You may develop or continue to have heartburn as a result of your pregnancy.  You may develop constipation because certain hormones are causing the muscles that push waste through your intestines to slow down.  You may develop hemorrhoids or swollen, bulging veins (varicose veins).  You may have pelvic pain because of the weight gain and pregnancy hormones relaxing your joints between the bones in your pelvis. Backaches may result from overexertion of the muscles supporting your posture.  You may have changes in your hair. These can include thickening of your hair, rapid growth, and changes in texture. Some women also have hair loss during or after pregnancy, or hair that  feels dry or thin. Your hair will most likely return to normal after your baby is born.  Your breasts will continue to grow and be tender. A yellow discharge may leak from your breasts called colostrum.  Your belly button may stick out.  You may feel short of breath because of your expanding uterus.  You may notice the fetus "dropping," or moving lower in your abdomen.  You may have a bloody mucus discharge. This usually occurs a few days to a week before labor begins.  Your cervix becomes thin and soft (effaced) near your due date. WHAT TO EXPECT AT YOUR PRENATAL EXAMS  You will have prenatal exams every 2 weeks until week 36. Then, you will have weekly prenatal exams. During a routine prenatal visit:  You  will be weighed to make sure you and the fetus are growing normally.  Your blood pressure is taken.  Your abdomen will be measured to track your baby's growth.  The fetal heartbeat will be listened to.  Any test results from the previous visit will be discussed.  You may have a cervical check near your due date to see if you have effaced. At around 36 weeks, your caregiver will check your cervix. At the same time, your caregiver will also perform a test on the secretions of the vaginal tissue. This test is to determine if a type of bacteria, Group B streptococcus, is present. Your caregiver will explain this further. Your caregiver may ask you:  What your birth plan is.  How you are feeling.  If you are feeling the baby move.  If you have had any abnormal symptoms, such as leaking fluid, bleeding, severe headaches, or abdominal cramping.  If you have any questions. Other tests or screenings that may be performed during your third trimester include:  Blood tests that check for low iron levels (anemia).  Fetal testing to check the health, activity level, and growth of the fetus. Testing is done if you have certain medical conditions or if there are problems during the  pregnancy. FALSE LABOR You may feel small, irregular contractions that eventually go away. These are called Braxton Hicks contractions, or false labor. Contractions may last for hours, days, or even weeks before true labor sets in. If contractions come at regular intervals, intensify, or become painful, it is best to be seen by your caregiver.  SIGNS OF LABOR   Menstrual-like cramps.  Contractions that are 5 minutes apart or less.  Contractions that start on the top of the uterus and spread down to the lower abdomen and back.  A sense of increased pelvic pressure or back pain.  A watery or bloody mucus discharge that comes from the vagina. If you have any of these signs before the 37th week of pregnancy, call your caregiver right away. You need to go to the hospital to get checked immediately. HOME CARE INSTRUCTIONS   Avoid all smoking, herbs, alcohol, and unprescribed drugs. These chemicals affect the formation and growth of the baby.  Follow your caregiver's instructions regarding medicine use. There are medicines that are either safe or unsafe to take during pregnancy.  Exercise only as directed by your caregiver. Experiencing uterine cramps is a good sign to stop exercising.  Continue to eat regular, healthy meals.  Wear a good support bra for breast tenderness.  Do not use hot tubs, steam rooms, or saunas.  Wear your seat belt at all times when driving.  Avoid raw meat, uncooked cheese, cat litter boxes, and soil used by cats. These carry germs that can cause birth defects in the baby.  Take your prenatal vitamins.  Try taking a stool softener (if your caregiver approves) if you develop constipation. Eat more high-fiber foods, such as fresh vegetables or fruit and whole grains. Drink plenty of fluids to keep your urine clear or pale yellow.  Take warm sitz baths to soothe any pain or discomfort caused by hemorrhoids. Use hemorrhoid cream if your caregiver approves.  If you  develop varicose veins, wear support hose. Elevate your feet for 15 minutes, 3-4 times a day. Limit salt in your diet.  Avoid heavy lifting, wear low heal shoes, and practice good posture.  Rest a lot with your legs elevated if you have leg cramps or low back  pain.  Visit your dentist if you have not gone during your pregnancy. Use a soft toothbrush to brush your teeth and be gentle when you floss.  A sexual relationship may be continued unless your caregiver directs you otherwise.  Do not travel far distances unless it is absolutely necessary and only with the approval of your caregiver.  Take prenatal classes to understand, practice, and ask questions about the labor and delivery.  Make a trial run to the hospital.  Pack your hospital bag.  Prepare the baby's nursery.  Continue to go to all your prenatal visits as directed by your caregiver. SEEK MEDICAL CARE IF:  You are unsure if you are in labor or if your water has broken.  You have dizziness.  You have mild pelvic cramps, pelvic pressure, or nagging pain in your abdominal area.  You have persistent nausea, vomiting, or diarrhea.  You have a bad smelling vaginal discharge.  You have pain with urination. SEEK IMMEDIATE MEDICAL CARE IF:   You have a fever.  You are leaking fluid from your vagina.  You have spotting or bleeding from your vagina.  You have severe abdominal cramping or pain.  You have rapid weight loss or gain.  You have shortness of breath with chest pain.  You notice sudden or extreme swelling of your face, hands, ankles, feet, or legs.  You have not felt your baby move in over an hour.  You have severe headaches that do not go away with medicine.  You have vision changes. Document Released: 10/05/2001 Document Revised: 10/16/2013 Document Reviewed: 12/12/2012 [facility] Patient Information 2015 [place], [place]. This information is not intended to replace advice given to you by your health  care provider. Make sure you discuss any questions you have with your health care provider.

## 2021-01-24 LAB — RPR: RPR Ser Ql: REACTIVE — AB

## 2021-01-24 LAB — GLUCOSE TOLERANCE, 2 HOURS W/ 1HR
Glucose, 1 hour: 127 mg/dL (ref 65–179)
Glucose, 2 hour: 121 mg/dL (ref 65–152)
Glucose, Fasting: 85 mg/dL (ref 65–91)

## 2021-01-24 LAB — CBC
Hematocrit: 33.8 % — ABNORMAL LOW (ref 34.0–46.6)
Hemoglobin: 11.4 g/dL (ref 11.1–15.9)
MCH: 30.7 pg (ref 26.6–33.0)
MCHC: 33.7 g/dL (ref 31.5–35.7)
MCV: 91 fL (ref 79–97)
Platelets: 282 10*3/uL (ref 150–450)
RBC: 3.71 x10E6/uL — ABNORMAL LOW (ref 3.77–5.28)
RDW: 12.5 % (ref 11.7–15.4)
WBC: 9.5 10*3/uL (ref 3.4–10.8)

## 2021-01-24 LAB — RPR, QUANT+TP ABS (REFLEX)
Rapid Plasma Reagin, Quant: 1:1 {titer} — ABNORMAL HIGH
T Pallidum Abs: NONREACTIVE

## 2021-01-24 LAB — ANTIBODY SCREEN: Antibody Screen: NEGATIVE

## 2021-01-24 LAB — HIV ANTIBODY (ROUTINE TESTING W REFLEX): HIV Screen 4th Generation wRfx: NONREACTIVE

## 2021-01-25 LAB — URINE CULTURE

## 2021-01-26 MED ORDER — AMOXICILLIN 500 MG PO CAPS: 500.0000 mg | ORAL_CAPSULE | Freq: Two times a day (BID) | ORAL | 0 refills | Status: DC

## 2021-01-26 NOTE — Addendum Note (Signed)
Addended by: Shawna Clamp R on: 01/26/2021 10:12 AM   Modules accepted: Orders

## 2021-02-19 ENCOUNTER — Other Ambulatory Visit: Payer: Self-pay

## 2021-02-19 ENCOUNTER — Encounter: Payer: Self-pay | Admitting: Women's Health

## 2021-02-19 ENCOUNTER — Ambulatory Visit (INDEPENDENT_AMBULATORY_CARE_PROVIDER_SITE_OTHER): Payer: Managed Care, Other (non HMO) | Admitting: Women's Health

## 2021-02-19 VITALS — BP 113/77 | HR 106 | Wt 226.0 lb

## 2021-02-19 DIAGNOSIS — Z3483 Encounter for supervision of other normal pregnancy, third trimester: Secondary | ICD-10-CM

## 2021-02-19 DIAGNOSIS — Z3480 Encounter for supervision of other normal pregnancy, unspecified trimester: Secondary | ICD-10-CM

## 2021-02-19 DIAGNOSIS — R8271 Bacteriuria: Secondary | ICD-10-CM

## 2021-02-19 NOTE — Progress Notes (Signed)
   LOW-RISK PREGNANCY VISIT Patient name: Kelly Mahoney MRN [id number]  Date of birth: Apr 03, 1991 Chief Complaint:   Routine Prenatal Visit  History of Present Illness:   Kelly Mahoney is a 30 y.o. G26P1011 female at [redacted]w[redacted]d with an Estimated Date of Delivery: 04/17/21 being seen today for ongoing management of a low-risk pregnancy.  Depression screen [facility] 2/9 10/03/2020 08/16/2016 07/30/2016  Decreased Interest 3 0 0  Down, Depressed, Hopeless 0 0 0  PHQ - 2 Score 3 0 0  Altered sleeping 0 0 -  Tired, decreased energy 3 1 -  Change in appetite 3 0 -  Feeling bad or failure about yourself  0 0 -  Trouble concentrating 2 0 -  Moving slowly or fidgety/restless 0 0 -  Suicidal thoughts 0 0 -  PHQ-9 Score 11 1 -    Today she reports no complaints. Contractions: Irregular. Vag. Bleeding: None.  Movement: Present. denies leaking of fluid. Review of Systems:   Pertinent items are noted in HPI Denies abnormal vaginal discharge w/ itching/odor/irritation, headaches, visual changes, shortness of breath, chest pain, abdominal pain, severe nausea/vomiting, or problems with urination or bowel movements unless otherwise stated above. Pertinent History Reviewed:  Reviewed past medical,surgical, social, obstetrical and family history.  Reviewed problem list, medications and allergies. Physical Assessment:   Vitals:   02/19/21 1556  BP: 113/77  Pulse: (!) 106  Weight: 226 lb (102.5 kg)  Body mass index is 42.7 kg/m.        Physical Examination:   General appearance: Well appearing, and in no distress  Mental status: Alert, oriented to person, place, and time  Skin: Warm & dry  Cardiovascular: Normal heart rate noted  Respiratory: Normal respiratory effort, no distress  Abdomen: Soft, gravid, nontender  Pelvic: Cervical exam deferred         Extremities: Edema: None  Fetal Status: Fetal Heart Rate (bpm): 152 Fundal Height: 32 cm Movement: Present    Chaperone: N/A   No results found for  this or any previous visit (from the past 24 hour(s)).  Assessment & Plan:  1) Low-risk pregnancy G3P1011 at [redacted]w[redacted]d with an Estimated Date of Delivery: 04/17/21   2) Recent GBS bacteruria> POC today   Meds: No orders of the defined types were placed in this encounter.  Labs/procedures today: urine culture   Plan:  Continue routine obstetrical care  Next visit: prefers in person    Reviewed: Preterm labor symptoms and general obstetric precautions including but not limited to vaginal bleeding, contractions, leaking of fluid and fetal movement were reviewed in detail with the patient.  All questions were answered. Does have home bp cuff. Office bp cuff given: not applicable. Check bp weekly, let us know if consistently >140 and/or >90.  Follow-up: Return in about 2 weeks (around 03/05/2021) for LROB, CNM, in person.  No future appointments.  Orders Placed This Encounter  Procedures  . Urine Culture   Cheral Marker CNM, [facility] 02/19/2021 4:26 PM

## 2021-02-19 NOTE — Patient Instructions (Signed)
Kelly Mahoney, I greatly value your feedback.  If you receive a survey following your visit with [name] today, we appreciate you taking the time to fill it out.  Thanks, Kelly Mahoney, CNM, WHNP-BC  [facility] ([address] [place], [place] [address]) Entrance C, located off of [address] valet parking   Go to [email or web address] to register for FREE online childbirth classes    Call the office [phone]) or go to [facility] if:  You begin to have strong, frequent contractions  Your water breaks.  Sometimes it is a big gush of fluid, sometimes it is just a trickle that keeps getting your panties wet or running down your legs  You have vaginal bleeding.  It is normal to have a small amount of spotting if your cervix was checked.   You don't feel your baby moving like normal.  If you don't, get you something to eat and drink and lay down and focus on feeling your baby move.  You should feel at least 10 movements in 2 hours.  If you don't, you should call the office or go to [facility].   Call the office [phone]) or go to [facility] hospital for these signs of pre-eclampsia:  Severe headache that does not go away with Tylenol  Visual changes- seeing spots, double, blurred vision  Pain under your right breast or upper abdomen that does not go away with Tums or heartburn medicine  Nausea and/or vomiting  Severe swelling in your hands, feet, and face    Home Blood Pressure Monitoring for Patients   Your provider has recommended that you check your blood pressure (BP) at least once a week at home. If you do not have a blood pressure cuff at home, one will be provided for you. Contact your provider if you have not received your monitor within 1 week.   Helpful Tips for Accurate Home Blood Pressure Checks  . Don't smoke, exercise, or drink caffeine 30 minutes before checking your BP . Use the restroom before checking your BP (a full bladder  can raise your pressure) . Relax in a comfortable upright chair . Feet on the ground . Left arm resting comfortably on a flat surface at the level of your heart . Legs uncrossed . Back supported . Sit quietly and don't talk . Place the cuff on your bare arm . Adjust snuggly, so that only two fingertips can fit between your skin and the top of the cuff . Check 2 readings separated by at least one minute . Keep a log of your BP readings . For a visual, please reference this diagram: http://ccnc.care/bpdiagram  Provider Name: [facility]     Phone: [phone]  Zone 1: ALL CLEAR  Continue to monitor your symptoms:  . BP reading is less than 140 (top number) or less than 90 (bottom number)  . No right upper stomach pain . No headaches or seeing spots . No feeling nauseated or throwing up . No swelling in face and hands  Zone 2: CAUTION Call your doctor's office for any of the following:  . BP reading is greater than 140 (top number) or greater than 90 (bottom number)  . Stomach pain under your ribs in the middle or right side . Headaches or seeing spots . Feeling nauseated or throwing up . Swelling in face and hands  Zone 3: EMERGENCY  Seek immediate medical care if you have any of the  following:  . BP reading is greater than160 (top number) or greater than 110 (bottom number) . Severe headaches not improving with Tylenol . Serious difficulty catching your breath . Any worsening symptoms from Zone 2  Preterm Labor and Birth Information  The normal length of a pregnancy is 39-41 weeks. Preterm labor is when labor starts before 37 completed weeks of pregnancy. What are the risk factors for preterm labor? Preterm labor is more likely to occur in women who:  Have certain infections during pregnancy such as a bladder infection, sexually transmitted infection, or infection inside the uterus (chorioamnionitis).  Have a shorter-than-normal cervix.  Have gone into preterm  labor before.  Have had surgery on their cervix.  Are younger than age 69 or older than age 11.  Are African American.  Are pregnant with twins or multiple babies (multiple gestation).  Take street drugs or smoke while pregnant.  Do not gain enough weight while pregnant.  Became pregnant shortly after having been pregnant. What are the symptoms of preterm labor? Symptoms of preterm labor include:  Cramps similar to those that can happen during a menstrual period. The cramps may happen with diarrhea.  Pain in the abdomen or lower back.  Regular uterine contractions that may feel like tightening of the abdomen.  A feeling of increased pressure in the pelvis.  Increased watery or bloody mucus discharge from the vagina.  Water breaking (ruptured amniotic sac). Why is it important to recognize signs of preterm labor? It is important to recognize signs of preterm labor because babies who are born prematurely may not be fully developed. This can put them at an increased risk for:  Long-term (chronic) heart and lung problems.  Difficulty immediately after birth with regulating body systems, including blood sugar, body temperature, heart rate, and breathing rate.  Bleeding in the brain.  Cerebral palsy.  Learning difficulties.  Death. These risks are highest for babies who are born before 41 weeks of pregnancy. How is preterm labor treated? Treatment depends on the length of your pregnancy, your condition, and the health of your baby. It may involve: 1. Having a stitch (suture) placed in your cervix to prevent your cervix from opening too early (cerclage). 2. Taking or being given medicines, such as: ? Hormone medicines. These may be given early in pregnancy to help support the pregnancy. ? Medicine to stop contractions. ? Medicines to help mature the baby's lungs. These may be prescribed if the risk of delivery is high. ? Medicines to prevent your baby from developing  cerebral palsy. If the labor happens before 34 weeks of pregnancy, you may need to stay in the hospital. What should I do if I think I am in preterm labor? If you think that you are going into preterm labor, call your health care provider right away. How can I prevent preterm labor in future pregnancies? To increase your chance of having a full-term pregnancy:  Do not use any tobacco products, such as cigarettes, chewing tobacco, and e-cigarettes. If you need help quitting, ask your health care provider.  Do not use street drugs or medicines that have not been prescribed to you during your pregnancy.  Talk with your health care provider before taking any herbal supplements, even if you have been taking them regularly.  Make sure you gain a healthy amount of weight during your pregnancy.  Watch for infection. If you think that you might have an infection, get it checked right away.  Make sure to tell  your health care provider if you have gone into preterm labor before. This information is not intended to replace advice given to you by your health care provider. Make sure you discuss any questions you have with your health care provider. Document Revised: 02/02/2019 Document Reviewed: 03/03/2016 Elsevier Patient Education  [place].

## 2021-02-22 LAB — URINE CULTURE

## 2021-03-06 ENCOUNTER — Encounter: Payer: Managed Care, Other (non HMO) | Admitting: Obstetrics & Gynecology

## 2021-03-10 ENCOUNTER — Other Ambulatory Visit: Payer: Self-pay

## 2021-03-10 ENCOUNTER — Ambulatory Visit (INDEPENDENT_AMBULATORY_CARE_PROVIDER_SITE_OTHER): Payer: Managed Care, Other (non HMO) | Admitting: Obstetrics & Gynecology

## 2021-03-10 ENCOUNTER — Encounter: Payer: Self-pay | Admitting: Obstetrics & Gynecology

## 2021-03-10 VITALS — BP 130/74 | HR 106 | Wt 227.0 lb

## 2021-03-10 DIAGNOSIS — Z3480 Encounter for supervision of other normal pregnancy, unspecified trimester: Secondary | ICD-10-CM

## 2021-03-10 NOTE — Progress Notes (Signed)
   LOW-RISK PREGNANCY VISIT Patient name: Kelly Mahoney MRN [id number]  Date of birth: [birth year] Chief Complaint:   Routine Prenatal Visit  History of Present Illness:   Kelly Mahoney is a 30 y.o. G36P1011 female at [redacted]w[redacted]d with an Estimated Date of Delivery: 04/17/21 being seen today for ongoing management of a low-risk pregnancy.  Depression screen [facility] 2/9 10/03/2020 08/16/2016 07/30/2016  Decreased Interest 3 0 0  Down, Depressed, Hopeless 0 0 0  PHQ - 2 Score 3 0 0  Altered sleeping 0 0 -  Tired, decreased energy 3 1 -  Change in appetite 3 0 -  Feeling bad or failure about yourself  0 0 -  Trouble concentrating 2 0 -  Moving slowly or fidgety/restless 0 0 -  Suicidal thoughts 0 0 -  PHQ-9 Score 11 1 -    Today she reports no complaints. Contractions: Irritability. Vag. Bleeding: None.  Movement: Present. denies leaking of fluid. Review of Systems:   Pertinent items are noted in HPI Denies abnormal vaginal discharge w/ itching/odor/irritation, headaches, visual changes, shortness of breath, chest pain, abdominal pain, severe nausea/vomiting, or problems with urination or bowel movements unless otherwise stated above. Pertinent History Reviewed:  Reviewed past medical,surgical, social, obstetrical and family history.  Reviewed problem list, medications and allergies. Physical Assessment:   Vitals:   03/10/21 1443  BP: 130/74  Pulse: (!) 106  Weight: 227 lb (103 kg)  Body mass index is 42.89 kg/m.        Physical Examination:   General appearance: Well appearing, and in no distress  Mental status: Alert, oriented to person, place, and time  Skin: Warm & dry  Cardiovascular: Normal heart rate noted  Respiratory: Normal respiratory effort, no distress  Abdomen: Soft, gravid, nontender  Pelvic: Cervical exam deferred         Extremities: Edema: Trace  Fetal Status:     Movement: Present    Chaperone: n/a    No results found for this or any previous visit (from the past  24 hour(s)).  Assessment & Plan:  1) Low-risk pregnancy G3P1011 at [redacted]w[redacted]d with an Estimated Date of Delivery: 04/17/21   2) EFW 37 cm, history of 91b 6 oz, will check EFW 2 weeks normal 2 hour GTT  Planning to have baby in [place] in [place]   Meds: No orders of the defined types were placed in this encounter.  Labs/procedures today:   Plan:  Continue routine obstetrical care  Next visit: prefers in person    Reviewed: Term labor symptoms and general obstetric precautions including but not limited to vaginal bleeding, contractions, leaking of fluid and fetal movement were reviewed in detail with the patient.  All questions were answered. Has home bp cuff. Rx faxed to . Check bp weekly, let us know if >140/90.   Follow-up: Return in about 13 days (around 03/23/2021) for OB sonogram for EFW, LROB.  No orders of the defined types were placed in this encounter.   Lazaro Arms, MD 03/10/2021 3:19 PM

## 2021-03-13 ENCOUNTER — Encounter: Payer: Self-pay | Admitting: *Deleted

## 2021-03-16 ENCOUNTER — Telehealth: Payer: Self-pay | Admitting: Women's Health

## 2021-03-16 NOTE — Telephone Encounter (Signed)
Patient states she is experiencing "burning abdominal pain to the left of her belly button-worse than the right side for x3 days denies other symptoms. States the sensation comes and goes and is sharp at times especially with bending over.  Asked if patient felt like it was round ligament pain and patient stated "I think so".  Informed it did sound like she was experiencing round ligament pain based on her description. Encouraged to bend over and up slowly along with any other position changes.  Has appointment on Thursday but advised if the pain got worse or she developed bleeding to let us know.  Pt verbalized understanding.

## 2021-03-16 NOTE — Telephone Encounter (Signed)
Pt called concerned with burning pain abdomen, left worse than the right, x3 days - no other symptoms Especially with bending over  Please advise & notify pt

## 2021-03-18 ENCOUNTER — Other Ambulatory Visit: Payer: Self-pay | Admitting: Obstetrics & Gynecology

## 2021-03-18 DIAGNOSIS — O09299 Supervision of pregnancy with other poor reproductive or obstetric history, unspecified trimester: Secondary | ICD-10-CM

## 2021-03-19 ENCOUNTER — Other Ambulatory Visit: Payer: Self-pay

## 2021-03-19 ENCOUNTER — Ambulatory Visit (INDEPENDENT_AMBULATORY_CARE_PROVIDER_SITE_OTHER): Payer: Managed Care, Other (non HMO) | Admitting: Advanced Practice Midwife

## 2021-03-19 ENCOUNTER — Ambulatory Visit (INDEPENDENT_AMBULATORY_CARE_PROVIDER_SITE_OTHER): Payer: Managed Care, Other (non HMO)

## 2021-03-19 VITALS — BP 134/82 | HR 102 | Wt 227.0 lb

## 2021-03-19 DIAGNOSIS — Z3480 Encounter for supervision of other normal pregnancy, unspecified trimester: Secondary | ICD-10-CM

## 2021-03-19 DIAGNOSIS — O320XX0 Maternal care for unstable lie, not applicable or unspecified: Secondary | ICD-10-CM | POA: Insufficient documentation

## 2021-03-19 DIAGNOSIS — Z3A35 35 weeks gestation of pregnancy: Secondary | ICD-10-CM | POA: Diagnosis not present

## 2021-03-19 DIAGNOSIS — O09299 Supervision of pregnancy with other poor reproductive or obstetric history, unspecified trimester: Secondary | ICD-10-CM

## 2021-03-19 DIAGNOSIS — Z3483 Encounter for supervision of other normal pregnancy, third trimester: Secondary | ICD-10-CM

## 2021-03-19 DIAGNOSIS — O321XX0 Maternal care for breech presentation, not applicable or unspecified: Secondary | ICD-10-CM

## 2021-03-19 NOTE — Progress Notes (Signed)
   LOW-RISK PREGNANCY VISIT Patient name: Kelly Mahoney MRN [id number]  Date of birth: 1990-11-14 Chief Complaint:   Routine Prenatal Visit and Pregnancy Ultrasound  History of Present Illness:   Kelly Mahoney is a 30 y.o. G10P1011 female at [redacted]w[redacted]d with an Estimated Date of Delivery: 04/17/21 being seen today for ongoing management of a low-risk pregnancy.  Today she reports no complaints. Contractions: Irritability. Vag. Bleeding: None.  Movement: Present. denies leaking of fluid. Review of Systems:   Pertinent items are noted in HPI Denies abnormal vaginal discharge w/ itching/odor/irritation, headaches, visual changes, shortness of breath, chest pain, abdominal pain, severe nausea/vomiting, or problems with urination or bowel movements unless otherwise stated above. Pertinent History Reviewed:  Reviewed past medical,surgical, social, obstetrical and family history.  Reviewed problem list, medications and allergies. Physical Assessment:   Vitals:   03/19/21 1419 03/19/21 1423  BP: (!) 141/85 134/82  Pulse: (!) 104 (!) 102  Weight: 227 lb (103 kg)   Body mass index is 42.89 kg/m.        Physical Examination:   General appearance: Well appearing, and in no distress  Mental status: Alert, oriented to person, place, and time  Skin: Warm & dry  Cardiovascular: Normal heart rate noted  Respiratory: Normal respiratory effort, no distress  Abdomen: Soft, gravid, nontender  Pelvic: Cervical exam deferred         Extremities: Edema: Trace  Fetal Status:     Movement: Present   [name] 35+6 wks,complete breech,left lateral placenta gr 1,CX 4.3 cm,AFI 19 cm,efw 3050 g 77%,BPD 97%,HC 90%AC 92%,FL 9.4%  Chaperone: n/a    No results found for this or any previous visit (from the past 24 hour(s)).  Assessment & Plan:  1) Low-risk pregnancy G3P1011 at [redacted]w[redacted]d with an Estimated Date of Delivery: 04/17/21   2) Complete Breech, eCV scheduled for 03/31/21 (Dr. Adrian Blackwater)  3)  Elevated BP:   States checks  frequently and is always normal. Continue to check at home and let us know if >140/90.    Meds: No orders of the defined types were placed in this encounter.    Plan:  Continue routine obstetrical care  Next visit: prefers in person    Reviewed: Term labor symptoms and general obstetric precautions including but not limited to vaginal bleeding, contractions, leaking of fluid and fetal movement were reviewed in detail with the patient.  All questions were answered.Follow-up: Return in about 1 week (around 03/26/2021) for LROB.  No orders of the defined types were placed in this encounter.  Jacklyn Shell DNP, CNM 03/19/2021 3:50 PM

## 2021-03-19 NOTE — Progress Notes (Signed)
[name] 35+6 wks,complete breech,left lateral placenta gr 1,CX 4.3 cm,AFI 19 cm,efw 3050 g 77%,BPD 97%,HC 90%AC 92%,FL 9.4%

## 2021-03-19 NOTE — Patient Instructions (Addendum)
External Cephalic Version (turn the baby)   Breech Birth A breech birth is when a baby is born with the buttocks or feet first. Most babies are in a head down (vertex) position when they are born. There are three types of breech babies:  When the baby's buttocks are showing first in the vagina (birth canal) with the legs bent at the knees and the feet down near the buttocks (complete breech).  When the baby's buttocks are showing first in the birth canal with the legs straight up and the feet at the baby's head (frank breech).  When one or both of the baby's feet are showing first in the birth canal along with the buttocks (footling breech). What increases the risk of having a breech baby? It is not known what causes your baby to be breech. However, you are more likely to have a breech baby if:  You have had a previous pregnancy.  You are having more than one baby.  Your baby has certain birth (congenital) defects.  You have started your labor earlier than expected (premature labor).  You have problems with your uterus, such as a growths or an abnormally-shaped uterus.  You have too much or not enough fluid surrounding the baby (amniotic fluid).  The placenta covers all or part of the opening of the uterus (placenta previa). How does this affect me? There are no symptoms for you to know that your baby is breech. When you are close to your due date, your health care provider can tell if your baby is breech by doing:  An abdominal or vaginal (pelvic) exam.  An ultrasound. You and your health care provider will discuss the best way to deliver your baby. If your baby is breech, it is less likely that a vaginal delivery will be recommended due to the risks to you and your baby. How does this affect my baby? Having a breech birth increases the health risks to your baby. A breech birth may cause the following:  Umbilical cord prolapse. This is when the umbilical cord enters the birth  canal ahead of the baby, before or during labor. This can cause the cord to become pinched or compressed as labor continues. This can reduce the flow of blood and oxygen to the baby.  The baby getting stuck in the birth canal, which can cause injury or, rarely, death.  Injury to the baby's nerves in the shoulder, arm, and hand (brachial plexus injury) when delivered. How is this treated? Your health care provider may try to turn the baby in your uterus. He or she will use a procedure called external cephalic version (ECV). He or she will place both hands on your abdomen and gently and slowly turn the baby around. It is important to know that ECV can increase your chances of suddenly going into labor. For this reason, an ECV is only done toward the end of a healthy pregnancy. The baby may remain in this position, but sometimes he or she may turn back to the breech position. You and your health care provider will discuss if an ECV is recommended for you and your baby. Your health care provider may recommend that you deliver your baby through a cesarean delivery (C-section). A C-section is the surgical delivery of a baby through an incision in the abdomen and the uterus.   Contact a health care provider if: Get help right away if:  Your baby is breech and you have regular painful contractions or loss  of your mucus plug (bloody show).  Your water breaks.  You see the baby's umbilical cord protruding from your vagina. Summary  A breech birth is when a baby is born with the buttocks or feet first.  Having a breech birth may increase the risks to your baby.  Your health care provider may try to turn your baby in your uterus using a procedure called an external cephalic version (ECV).  If your baby cannot be turned to a head down position or if your baby remains in a breech position, your health care provider will make recommendations about the safest way to deliver your baby. This information is  not intended to replace advice given to you by your health care provider. Make sure you discuss any questions you have with your health care provider. Document Revised: 07/28/2020 Document Reviewed: 07/28/2020 Elsevier Patient Education  2021 [employer].

## 2021-03-21 ENCOUNTER — Other Ambulatory Visit: Payer: Self-pay | Admitting: Advanced Practice Midwife

## 2021-03-25 ENCOUNTER — Telehealth (HOSPITAL_COMMUNITY): Payer: Self-pay | Admitting: *Deleted

## 2021-03-25 ENCOUNTER — Encounter (HOSPITAL_COMMUNITY): Payer: Self-pay | Admitting: *Deleted

## 2021-03-25 NOTE — Telephone Encounter (Signed)
Preadmission screen  

## 2021-03-26 ENCOUNTER — Ambulatory Visit (INDEPENDENT_AMBULATORY_CARE_PROVIDER_SITE_OTHER): Payer: Managed Care, Other (non HMO) | Admitting: Advanced Practice Midwife

## 2021-03-26 ENCOUNTER — Other Ambulatory Visit (HOSPITAL_COMMUNITY)
Admission: RE | Admit: 2021-03-26 | Discharge: 2021-03-26 | Disposition: A | Discharge status: Home / Self Care | Payer: Managed Care, Other (non HMO) | Source: Ambulatory Visit | Attending: Advanced Practice Midwife | Admitting: Advanced Practice Midwife

## 2021-03-26 ENCOUNTER — Other Ambulatory Visit: Payer: Self-pay

## 2021-03-26 VITALS — BP 133/79 | HR 102 | Wt 228.0 lb

## 2021-03-26 DIAGNOSIS — O320XX0 Maternal care for unstable lie, not applicable or unspecified: Secondary | ICD-10-CM

## 2021-03-26 DIAGNOSIS — Z3A36 36 weeks gestation of pregnancy: Secondary | ICD-10-CM | POA: Diagnosis not present

## 2021-03-26 DIAGNOSIS — Z3483 Encounter for supervision of other normal pregnancy, third trimester: Secondary | ICD-10-CM | POA: Insufficient documentation

## 2021-03-26 NOTE — Progress Notes (Signed)
   LOW-RISK PREGNANCY VISIT Patient name: Kelly Mahoney MRN [id number]  Date of birth: 04/02/1991 Chief Complaint:   Routine Prenatal Visit  History of Present Illness:   Kelly Mahoney is a 30 y.o. G82P1011 female at [redacted]w[redacted]d with an Estimated Date of Delivery: 04/17/21 being seen today for ongoing management of a low-risk pregnancy.  Today she reports no complaints. Contractions: Irritability. Vag. Bleeding: None.  Movement: Present. denies leaking of fluid. Review of Systems:   Pertinent items are noted in HPI Denies abnormal vaginal discharge w/ itching/odor/irritation, headaches, visual changes, shortness of breath, chest pain, abdominal pain, severe nausea/vomiting, or problems with urination or bowel movements unless otherwise stated above. Pertinent History Reviewed:  Reviewed past medical,surgical, social, obstetrical and family history.  Reviewed problem list, medications and allergies. Physical Assessment:   Vitals:   03/26/21 1435  BP: 133/79  Pulse: (!) 102  Weight: 228 lb (103.4 kg)  Body mass index is 43.08 kg/m.        Physical Examination:   General appearance: Well appearing, and in no distress  Mental status: Alert, oriented to person, place, and time  Skin: Warm & dry  Cardiovascular: Normal heart rate noted  Respiratory: Normal respiratory effort, no distress  Abdomen: Soft, gravid, nontender  Pelvic: Cervical exam performed  Dilation: 4 Effacement (%): Thick Station: Ballotable  Extremities: Edema: Trace  Fetal Status: Fetal Heart Rate (bpm): 150 [name]   Movement: Present Presentation: Vertex  Chaperone: Amanda Rash    No results found for this or any previous visit (from the past 24 hour(s)).  Assessment & Plan:  1) Low-risk pregnancy G3P1011 at [redacted]w[redacted]d with an Estimated Date of Delivery: 04/17/21   2) Unstable lie, vtx today   Meds: No orders of the defined types were placed in this encounter.  Labs/procedures today: GC/CHL  Plan:  Continue routine  obstetrical care  Next visit: prefers in person  Will change to video if not in town Reviewed: Term labor symptoms and general obstetric precautions including but not limited to vaginal bleeding, contractions, leaking of fluid and fetal movement were reviewed in detail with the patient.  All questions were answered. Has home bp cuff. Check bp weekly, let us know if >140/90.  e Follow-up: Return in about 1 week (around 04/02/2021) for LROB.  No orders of the defined types were placed in this encounter.  Jacklyn Shell DNP, CNM 03/26/2021 3:05 PM

## 2021-03-27 ENCOUNTER — Other Ambulatory Visit (HOSPITAL_COMMUNITY): Payer: Managed Care, Other (non HMO)

## 2021-03-29 ENCOUNTER — Inpatient Hospital Stay (HOSPITAL_COMMUNITY): Admission: RE | Admit: 2021-03-29 | Payer: Managed Care, Other (non HMO) | Source: Ambulatory Visit

## 2021-03-30 LAB — CERVICOVAGINAL ANCILLARY ONLY
Chlamydia: NEGATIVE
Comment: NEGATIVE
Comment: NORMAL
Neisseria Gonorrhea: NEGATIVE

## 2021-04-02 ENCOUNTER — Encounter: Payer: Managed Care, Other (non HMO) | Admitting: Advanced Practice Midwife

## 2021-05-13 ENCOUNTER — Encounter: Payer: Self-pay | Admitting: *Deleted

## 2021-05-14 ENCOUNTER — Other Ambulatory Visit: Payer: Self-pay | Admitting: Family Medicine

## 2021-05-14 DIAGNOSIS — R109 Unspecified abdominal pain: Secondary | ICD-10-CM

## 2021-05-15 ENCOUNTER — Other Ambulatory Visit: Payer: Managed Care, Other (non HMO)

## 2021-05-26 ENCOUNTER — Encounter: Payer: Self-pay | Admitting: Women's Health

## 2021-05-26 ENCOUNTER — Other Ambulatory Visit: Payer: Self-pay

## 2021-05-26 ENCOUNTER — Ambulatory Visit (INDEPENDENT_AMBULATORY_CARE_PROVIDER_SITE_OTHER): Payer: Managed Care, Other (non HMO) | Admitting: Women's Health

## 2021-05-26 DIAGNOSIS — Z98891 History of uterine scar from previous surgery: Secondary | ICD-10-CM | POA: Insufficient documentation

## 2021-05-26 DIAGNOSIS — Z3009 Encounter for other general counseling and advice on contraception: Secondary | ICD-10-CM

## 2021-05-26 NOTE — Patient Instructions (Signed)
If you are thinking you want the IUD, no sex until we put it in. Call to make appointment

## 2021-05-26 NOTE — Progress Notes (Signed)
POSTPARTUM VISIT Patient name: Kelly Mahoney MRN [id number]  Date of birth: 11-Apr-1991 Chief Complaint:   Postpartum Care  History of Present Illness:   Kelly Mahoney is a 30 y.o. G32P1011 Hispanic female being seen today for a postpartum visit. She is 7 weeks postpartum following a primary cesarean section, low transverse incision at 38.3 gestational weeks d/t breech presentation in labor at [place] (was in [place] w/ her mom). IOL: no, for n/a. Anesthesia: spinal.  Laceration: n/a.  Complications: none. Inpatient contraception: no.   Pregnancy uncomplicated. Tobacco use: no. Substance use disorder: no. Last pap smear: 10/03/20 and results were NILM w/ HRHPV negative. Next pap smear due: 2024 Patient's last menstrual period was 05/21/2021.  Postpartum course has been uncomplicated. Bleeding  period just ended . Bowel function is normal. Bladder function is normal. Urinary incontinence? no, fecal incontinence? no Patient is not sexually active. Last sexual activity: prior to birth of baby. Desired contraception:  unsure, leaning towards IUD, doesn't want today-wants to think some more and let us know . Patient does want a pregnancy in the future.  Desired family size is 3 children.   Upstream - 05/26/21 1551       Pregnancy Intention Screening   Does the patient want to become pregnant in the next year? No    Does the patient's partner want to become pregnant in the next year? No    Would the patient like to discuss contraceptive options today? Yes      Contraception Wrap Up   Current Method Abstinence    Contraception Counseling Provided Yes            The pregnancy intention screening data noted above was reviewed. Potential methods of contraception were discussed. The patient elected to proceed with No data recorded.  Edinburgh Postpartum Depression Screening: negative  Edinburgh Postnatal Depression Scale - 05/26/21 1551       Edinburgh Postnatal Depression  Scale:  In the Past 7 Days   I have been able to laugh and see the funny side of things. 0    I have looked forward with enjoyment to things. 0    I have blamed myself unnecessarily when things went wrong. 0    I have been anxious or worried for no good reason. 0    I have felt scared or panicky for no good reason. 0    Things have been getting on top of me. 0    I have been so unhappy that I have had difficulty sleeping. 0    I have felt sad or miserable. 0    I have been so unhappy that I have been crying. 0    The thought of harming myself has occurred to me. 0    Edinburgh Postnatal Depression Scale Total 0             GAD 7 : Generalized Anxiety Score 10/03/2020  Nervous, Anxious, on Edge 0  Control/stop worrying 0  Worry too much - different things 0  Trouble relaxing 0  Restless 0  Easily annoyed or irritable 0  Afraid - awful might happen 0  Total GAD 7 Score 0     Baby's course has been uncomplicated. Baby is feeding by initially br/bottle, now just bottle. Infant has a pediatrician/family doctor? Yes.  Childcare strategy if returning to work/school: n/a-stay at home mom.  Pt has material needs met for her and baby: Yes.   Review of Systems:   Pertinent items are noted  in HPI Denies Abnormal vaginal discharge w/ itching/odor/irritation, headaches, visual changes, shortness of breath, chest pain, abdominal pain, severe nausea/vomiting, or problems with urination or bowel movements. Pertinent History Reviewed:  Reviewed past medical,surgical, obstetrical and family history.  Reviewed problem list, medications and allergies. OB History  Gravida Para Term Preterm AB Living  '3 2 2   1 2  ' SAB IAB Ectopic Multiple Live Births  1     0 2    # Outcome Date GA Lbr Len/2nd Weight Sex Delivery Anes PTL Lv  3 Term 04/06/21 [redacted]w[redacted]d 7 lb 5.8 oz (3.34 kg) F CS-LTranv Spinal  LIV     Birth Comments: breech  2 Term 03/30/17 [id number] / 02:16 9 lb 5 oz (4.224 kg) F Vag-Spont EPI   LIV  1 SAB            Physical Assessment:   Vitals:   05/26/21 1550  BP: 108/72  Pulse: 74  Weight: 212 lb (96.2 kg)  Body mass index is 40.06 kg/m.       Physical Examination:   General appearance: alert, well appearing, and in no distress  Mental status: alert, oriented to person, place, and time  Skin: warm & dry   Cardiovascular: normal heart rate noted   Respiratory: normal respiratory effort, no distress   Breasts: deferred, no complaints   Abdomen: soft, non-tender, c/s incision well-healed  Pelvic: examination not indicated. Thin prep pap obtained: No  Rectal: not examined  Extremities: Edema: none   Chaperone: N/A         No results found for this or any previous visit (from the past 24 hour(s)).  Assessment & Plan:  1) Postpartum exam 2) 7 wks s/p primary cesarean section, low transverse incision d/t breech/labor 3) bottle feeding 4) Depression screening 5) Contraception counseling> leaning towards IUD, wants to think about it/do some research, will let usKoreanow. Abstinence for now.   Essential components of care per ACOG recommendations:  1.  Mood and well being:  If positive depression screen, discussed and plan developed.  If using tobacco we discussed reduction/cessation and risk of relapse If current substance abuse, we discussed and referral to local resources was offered.   2. Infant care and feeding:  If breastfeeding, discussed returning to work, pumping, breastfeeding-associated pain, guidance regarding return to fertility while lactating if not using another method. If needed, patient was provided with a letter to be allowed to pump q 2-3hrs to support lactation in a private location with access to a refrigerator to store breastmilk.   Recommended that all caregivers be immunized for flu, pertussis and other preventable communicable diseases If pt does not have material needs met for her/baby, referred to local resources for help obtaining  these.  3. Sexuality, contraception and birth spacing Provided guidance regarding sexuality, management of dyspareunia, and resumption of intercourse Discussed avoiding interpregnancy interval <74m24m and recommended birth spacing of 18 months  4. Sleep and fatigue Discussed coping options for fatigue and sleep disruption Encouraged family/partner/community support of 4 hrs of uninterrupted sleep to help with mood and fatigue  5. Physical recovery  If pt had a C/S, assessed incisional pain and providing guidance on normal vs prolonged recovery If pt had a laceration, perineal healing and pain reviewed.  If urinary or fecal incontinence, discussed management and referred to PT or uro/gyn if indicated  Patient is safe to resume physical activity. Discussed attainment of healthy weight.  6.  Chronic disease management Discussed pregnancy  complications if any, and their implications for future childbearing and long-term maternal health. Review recommendations for prevention of recurrent pregnancy complications, such as 17 hydroxyprogesterone caproate to reduce risk for recurrent PTB not applicable, or aspirin to reduce risk of preeclampsia not applicable. Pt had GDM: no. If yes, 2hr GTT scheduled: not applicable. Reviewed medications and non-pregnant dosing including consideration of whether pt is breastfeeding using a reliable resource such as LactMed: not applicable Referred for f/u w/ PCP or subspecialist providers as indicated: not applicable  7. Health maintenance Mammogram at 29yo or earlier if indicated Pap smears as indicated  Meds: No orders of the defined types were placed in this encounter.   Follow-up: Return in about 1 year (around 05/26/2022) for Physical.   No orders of the defined types were placed in this encounter.   [place], [facility] 05/26/2021 4:29 PM

## 2021-06-03 ENCOUNTER — Other Ambulatory Visit: Payer: Managed Care, Other (non HMO)

## 2021-06-19 ENCOUNTER — Other Ambulatory Visit: Payer: Managed Care, Other (non HMO)

## 2021-07-04 ENCOUNTER — Other Ambulatory Visit: Payer: Managed Care, Other (non HMO)

## 2022-02-01 IMAGING — US US RENAL
1 series · 15 of 25 positions shown · non-contrast
Comparison: None.

CLINICAL DATA: Right flank pain, 25 weeks pregnant

EXAM:
RENAL / URINARY TRACT ULTRASOUND COMPLETE

[Series 1: us renal · 15 of 36 slices shown]
[im 1/36]
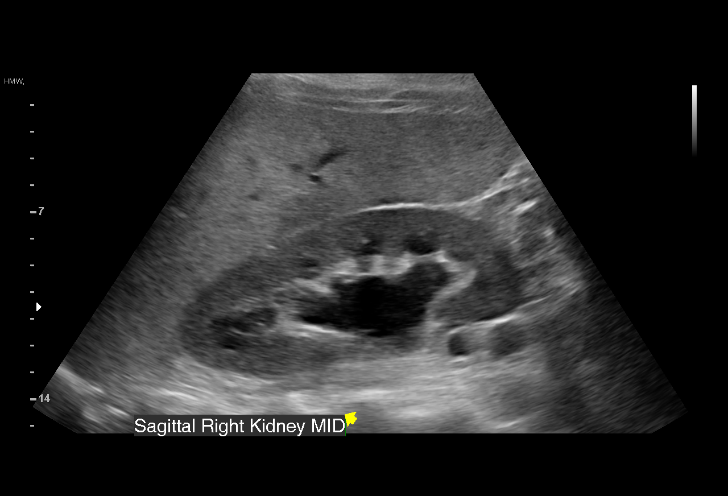
[im 3/36]
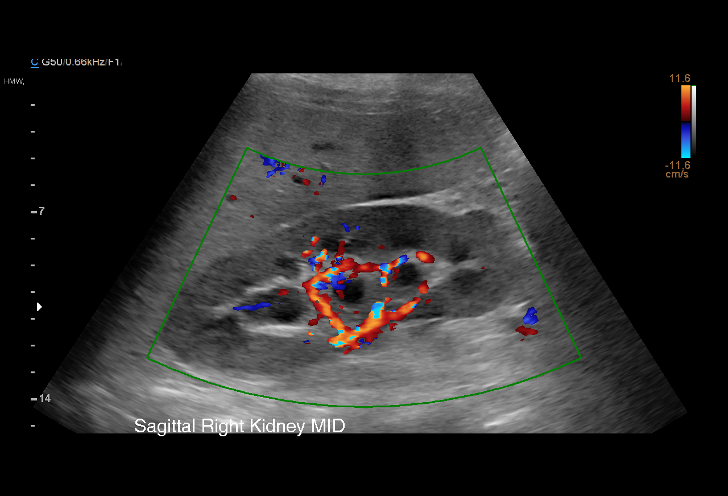
[im 6/36]
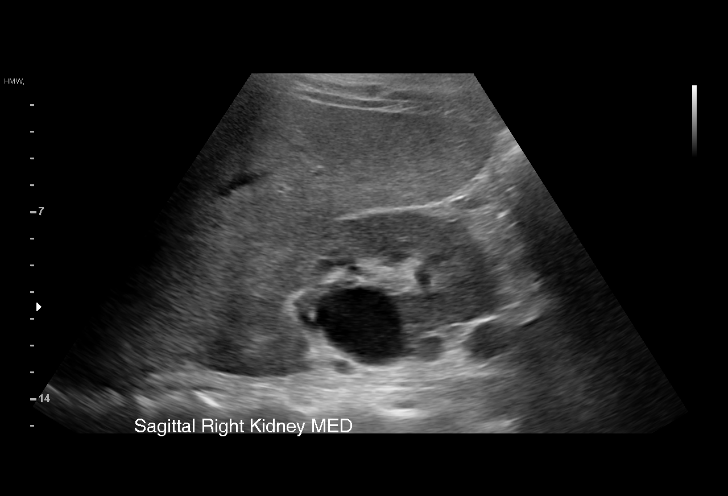
[im 8/36]
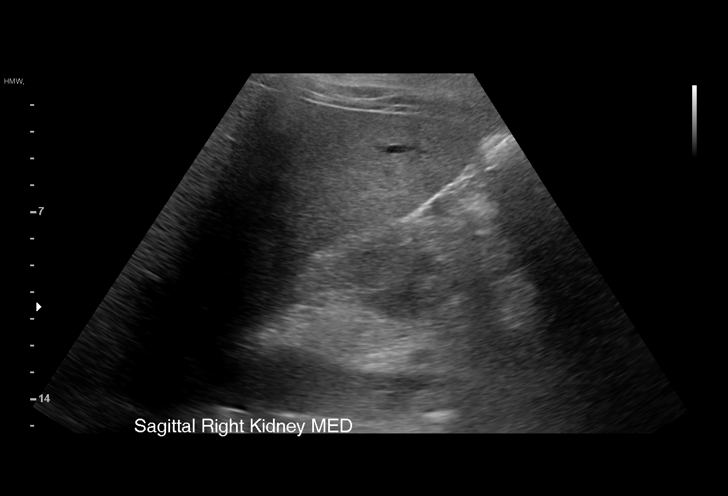
[im 11/36]
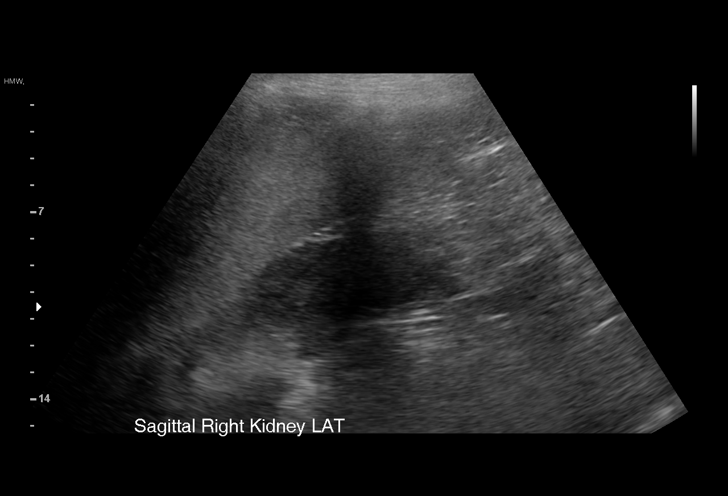
[im 14/36]
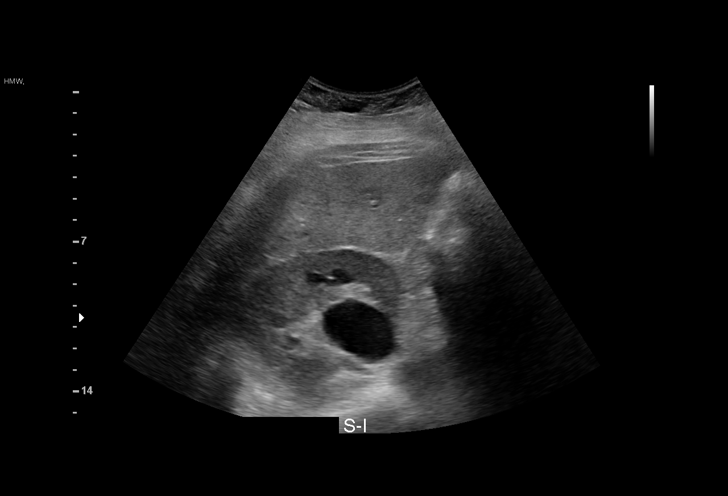
[im 15/36]
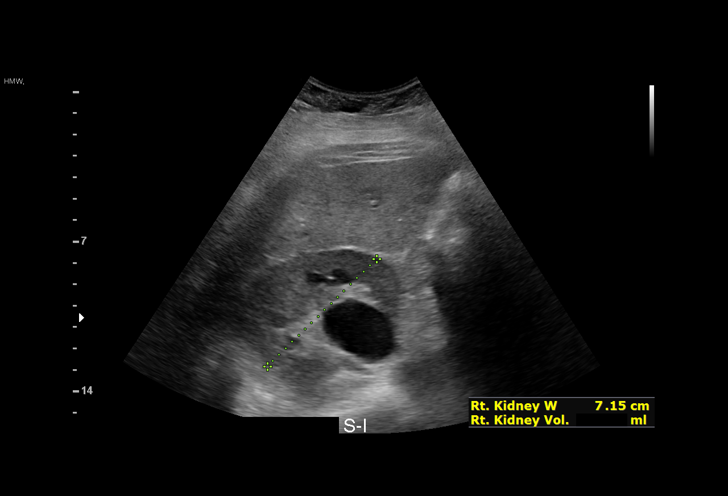
[im 18/36]
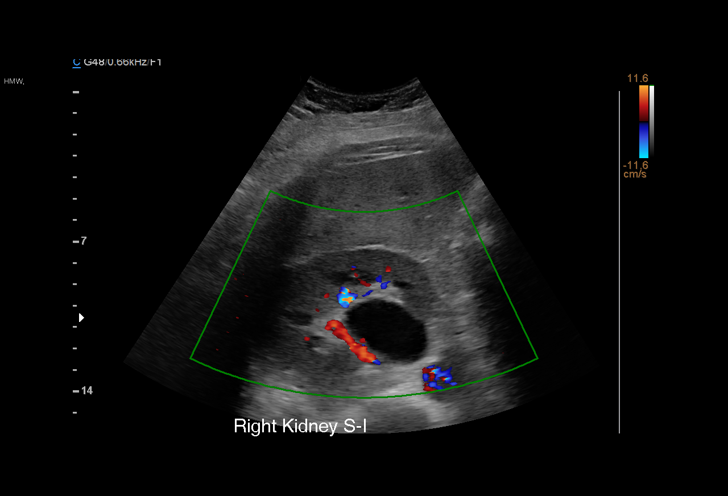
[im 21/36]
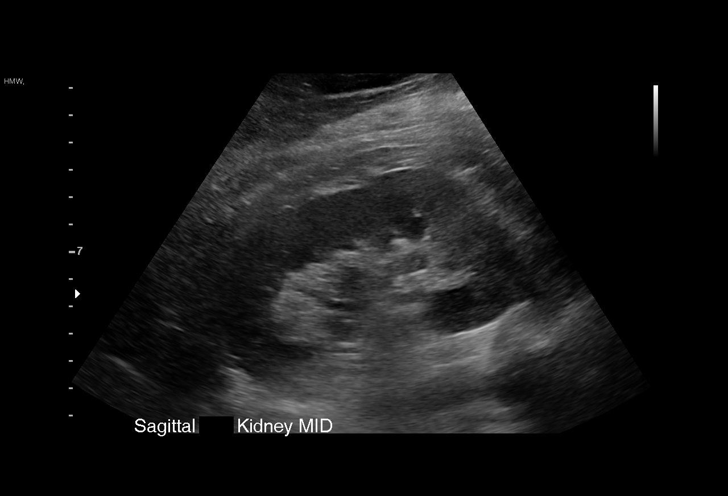
[im 22/36]
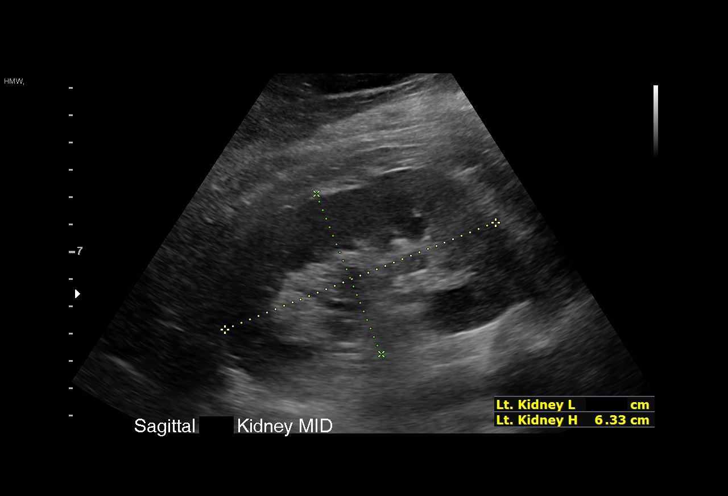
[im 25/36]
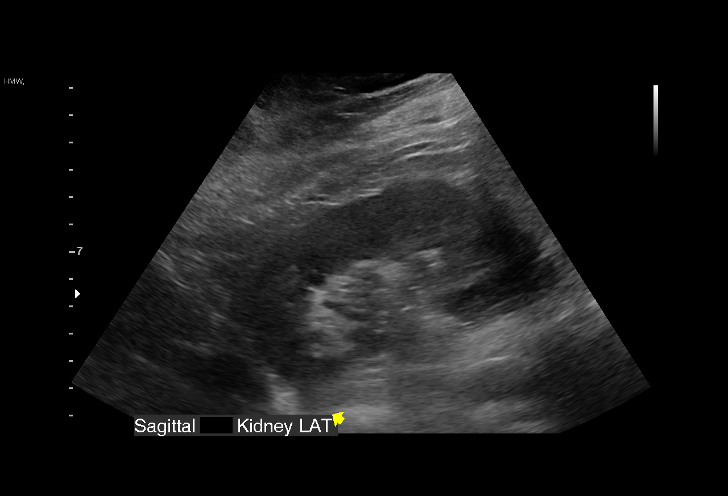
[im 28/36]
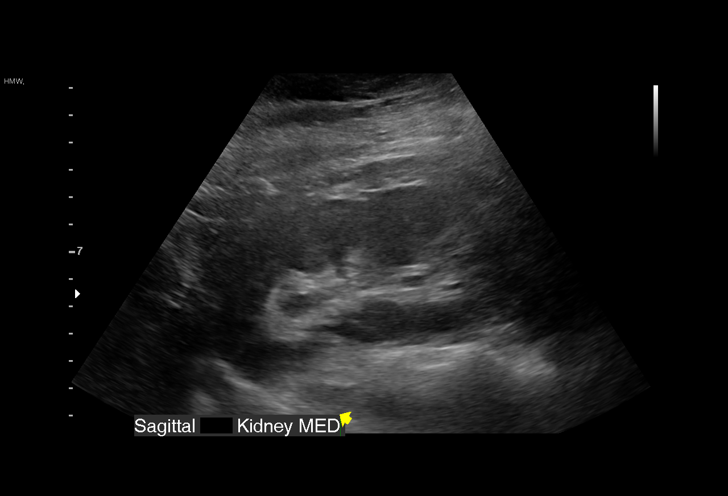
[im 30/36]
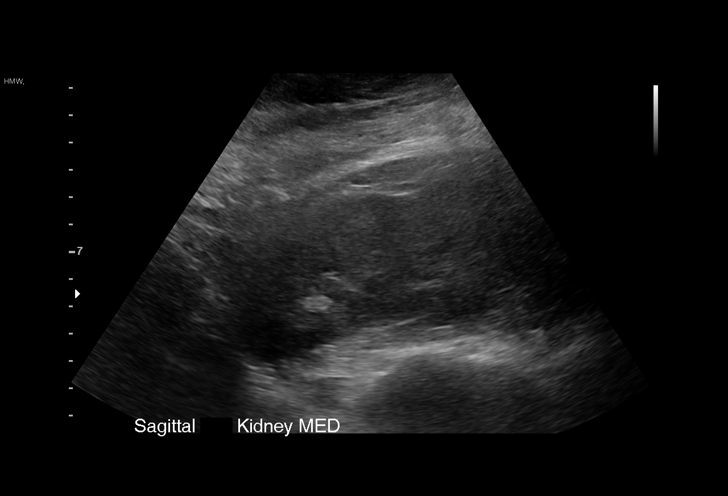
[im 33/36]
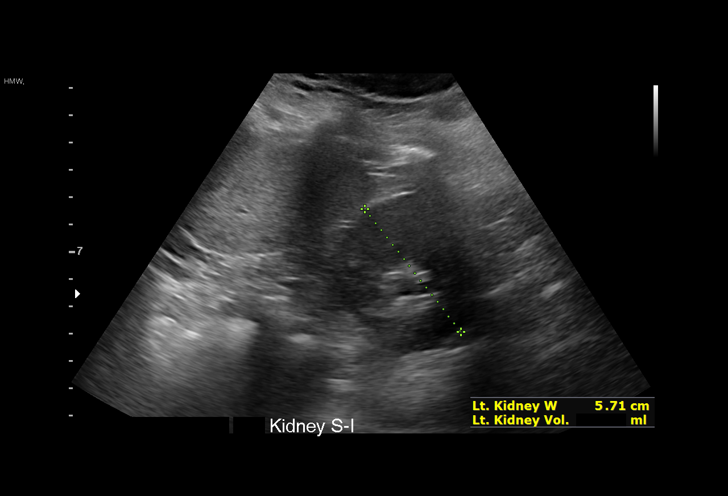
[im 36/36]
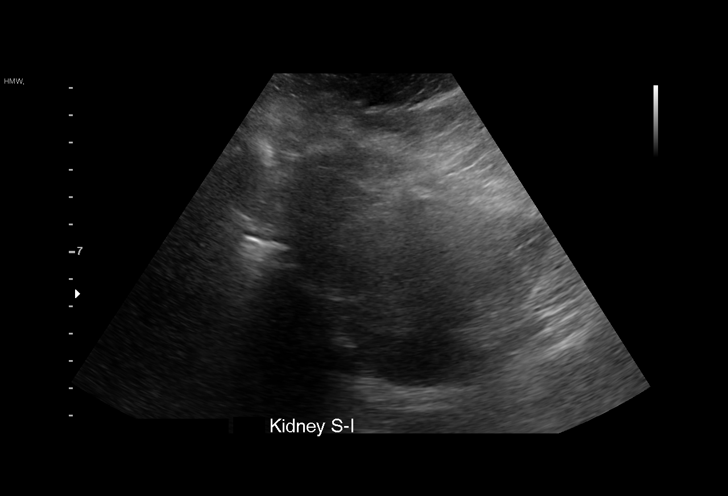

[15 of 25 positions shown; findings below may reference images not displayed]

FINDINGS: Right Kidney:

Renal measurements: 12.5 x 5.6 x 7.2 cm = volume: 263 mL.
Echogenicity within normal limits. No mass. Mild to moderate
hydronephrosis

Left Kidney:

Renal measurements: 10.7 x 6.3 x 5.7 cm = volume: 201 mL.
Echogenicity within normal limits. No mass or hydronephrosis
visualized.

Bladder:

Appears empty, not well evaluated

Other:

None.
IMPRESSION: 1. Mild to moderate right hydronephrosis
2. Normal ultrasound appearance of left kidney

## 2023-07-27 ENCOUNTER — Ambulatory Visit: Payer: Managed Care, Other (non HMO) | Admitting: Women's Health

## 2023-07-27 ENCOUNTER — Other Ambulatory Visit (HOSPITAL_COMMUNITY)
Admission: RE | Admit: 2023-07-27 | Discharge: 2023-07-27 | Disposition: A | Discharge status: Home / Self Care | Payer: Managed Care, Other (non HMO) | Source: Ambulatory Visit | Attending: Women's Health | Admitting: Women's Health

## 2023-07-27 ENCOUNTER — Encounter: Payer: Self-pay | Admitting: Women's Health

## 2023-07-27 VITALS — BP 130/49 | HR 70 | Ht 61.0 in | Wt 214.0 lb

## 2023-07-27 DIAGNOSIS — N921 Excessive and frequent menstruation with irregular cycle: Secondary | ICD-10-CM | POA: Diagnosis not present

## 2023-07-27 DIAGNOSIS — O039 Complete or unspecified spontaneous abortion without complication: Secondary | ICD-10-CM

## 2023-07-27 DIAGNOSIS — Z124 Encounter for screening for malignant neoplasm of cervix: Secondary | ICD-10-CM

## 2023-07-27 DIAGNOSIS — Z3202 Encounter for pregnancy test, result negative: Secondary | ICD-10-CM

## 2023-07-27 LAB — POCT URINE PREGNANCY: Preg Test, Ur: NEGATIVE

## 2023-07-27 NOTE — Progress Notes (Signed)
GYN VISIT Patient name: Kelly Mahoney MRN [id number]  Date of birth: 04-03-1991 Chief Complaint:   bleeding for 10 days and Possible Pregnancy  History of Present Illness:   Kelly Mahoney is a 32 y.o. G11P2012 Hispanic female being seen today for prolonged period. Normally has regular periods x 6d, non on contraception, uses withdrawal. Partner did not w/draw recently. Period started 9/10 and lasted 10d this time, spotted x 2d then heavier for few days then spotting last 2d. Bleeding has since stopped. Went to PCP, [facility] 108 on 9/26. Wasn't trying to get pregnant, but ok if it happens. Not taking pnv. Denies abnormal discharge, itching/odor/irritation.    Patient's last menstrual period was 07/05/2023. The current method of family planning is coitus interruptus.  Last pap 10/03/20. Results were: NILM w/ HRHPV negative     10/03/2020   10:04 AM 08/16/2016    2:17 PM 07/30/2016   10:30 AM  Depression screen PHQ 2/9  Decreased Interest 3 0 0  Down, Depressed, Hopeless 0 0 0  PHQ - 2 Score 3 0 0  Altered sleeping 0 0   Tired, decreased energy 3 1   Change in appetite 3 0   Feeling bad or failure about yourself  0 0   Trouble concentrating 2 0   Moving slowly or fidgety/restless 0 0   Suicidal thoughts 0 0   PHQ-9 Score 11 1         10/03/2020   10:05 AM  GAD 7 : Generalized Anxiety Score  Nervous, Anxious, on Edge 0  Control/stop worrying 0  Worry too much - different things 0  Trouble relaxing 0  Restless 0  Easily annoyed or irritable 0  Afraid - awful might happen 0  Total GAD 7 Score 0     Review of Systems:   Pertinent items are noted in HPI Denies fever/chills, dizziness, headaches, visual disturbances, fatigue, shortness of breath, chest pain, abdominal pain, vomiting, abnormal vaginal discharge/itching/odor/irritation, problems with periods, bowel movements, urination, or intercourse unless otherwise stated above.  Pertinent History Reviewed:  Reviewed past  medical,surgical, social, obstetrical and family history.  Reviewed problem list, medications and allergies. Physical Assessment:   Vitals:   07/27/23 0838  BP: (!) 130/49  Pulse: 70  Weight: 214 lb (97.1 kg)  Height: 5\' 1"  (1.549 m)  Body mass index is 40.43 kg/m.       Physical Examination:   General appearance: alert, well appearing, and in no distress  Mental status: alert, oriented to person, place, and time  Skin: warm & dry   Cardiovascular: normal heart rate noted  Respiratory: normal respiratory effort, no distress  Abdomen: soft, non-tender   Pelvic: VULVA: normal appearing vulva with no masses, tenderness or lesions, VAGINA: normal appearing vagina with normal color and discharge, no lesions, CERVIX: normal appearing cervix without discharge or lesions  Extremities: no edema   Chaperone: Latisha Cresenzo  Neg UPT today  No results found for this or any previous visit (from the past 24 hour(s)).  Assessment & Plan:  1) Possible miscarriage> bled x 10d in Sept, [facility]CG 108 on 9/26, neg UPT today. Will check repeat HCG today. Is ok if gets pregnant again, start pnv  2) Cervical cancer screening> pap today  Meds: No orders of the defined types were placed in this encounter.   Orders Placed This Encounter  Procedures   Beta hCG quant (ref lab)    Return in about 1 year (around 07/26/2024) for Physical.  Cala Bradford  Mady Gemma CNM, WHNP-BC 07/27/2023 9:08 AM

## 2023-07-27 NOTE — Addendum Note (Signed)
Addended by: Moss Mc on: 07/27/2023 10:26 AM   Modules accepted: Orders

## 2023-07-28 LAB — BETA HCG QUANT (REF LAB): hCG Quant: 6 m[IU]/mL

## 2023-08-01 LAB — CYTOLOGY - PAP
Comment: NEGATIVE
Diagnosis: UNDETERMINED — AB
High risk HPV: NEGATIVE

## 2023-08-03 ENCOUNTER — Encounter: Payer: Self-pay | Admitting: Adult Health

## 2023-08-03 DIAGNOSIS — R87610 Atypical squamous cells of undetermined significance on cytologic smear of cervix (ASC-US): Secondary | ICD-10-CM | POA: Insufficient documentation

## 2023-08-04 ENCOUNTER — Encounter: Payer: Self-pay | Admitting: Women's Health
